# Patient Record
Sex: Male | Born: 1945 | Race: White | Hispanic: No | Marital: Single | Smoking: Former smoker
Health system: Southern US, Community
[De-identification: ages and names within clinical notes are randomized; demographics above are authoritative.]

## PROBLEM LIST (undated history)

## (undated) DIAGNOSIS — I219 Acute myocardial infarction, unspecified: Secondary | ICD-10-CM

## (undated) DIAGNOSIS — R569 Unspecified convulsions: Secondary | ICD-10-CM

## (undated) DIAGNOSIS — M810 Age-related osteoporosis without current pathological fracture: Secondary | ICD-10-CM

## (undated) DIAGNOSIS — R197 Diarrhea, unspecified: Secondary | ICD-10-CM

## (undated) DIAGNOSIS — I739 Peripheral vascular disease, unspecified: Secondary | ICD-10-CM

## (undated) DIAGNOSIS — F0390 Unspecified dementia without behavioral disturbance: Secondary | ICD-10-CM

## (undated) DIAGNOSIS — H9190 Unspecified hearing loss, unspecified ear: Secondary | ICD-10-CM

## (undated) DIAGNOSIS — R519 Headache, unspecified: Secondary | ICD-10-CM

## (undated) DIAGNOSIS — G709 Myoneural disorder, unspecified: Secondary | ICD-10-CM

## (undated) DIAGNOSIS — H53489 Generalized contraction of visual field, unspecified eye: Secondary | ICD-10-CM

## (undated) DIAGNOSIS — H547 Unspecified visual loss: Secondary | ICD-10-CM

## (undated) DIAGNOSIS — I639 Cerebral infarction, unspecified: Secondary | ICD-10-CM

## (undated) DIAGNOSIS — I509 Heart failure, unspecified: Secondary | ICD-10-CM

## (undated) DIAGNOSIS — R011 Cardiac murmur, unspecified: Secondary | ICD-10-CM

## (undated) DIAGNOSIS — R51 Headache: Secondary | ICD-10-CM

## (undated) DIAGNOSIS — M1990 Unspecified osteoarthritis, unspecified site: Secondary | ICD-10-CM

## (undated) DIAGNOSIS — I499 Cardiac arrhythmia, unspecified: Secondary | ICD-10-CM

## (undated) DIAGNOSIS — I10 Essential (primary) hypertension: Secondary | ICD-10-CM

## (undated) DIAGNOSIS — K219 Gastro-esophageal reflux disease without esophagitis: Secondary | ICD-10-CM

## (undated) DIAGNOSIS — E7800 Pure hypercholesterolemia, unspecified: Secondary | ICD-10-CM

## (undated) DIAGNOSIS — Z950 Presence of cardiac pacemaker: Secondary | ICD-10-CM

## (undated) DIAGNOSIS — I2510 Atherosclerotic heart disease of native coronary artery without angina pectoris: Secondary | ICD-10-CM

## (undated) HISTORY — PX: VASCULAR SURGERY: SHX849

## (undated) HISTORY — PX: INSERT / REPLACE / REMOVE PACEMAKER: SUR710

## (undated) HISTORY — PX: OTHER SURGICAL HISTORY: SHX169

## (undated) HISTORY — PX: HERNIA REPAIR: SHX51

## (undated) HISTORY — PX: CARDIAC VALVE SURGERY: SHX40

---

## 1994-09-28 DIAGNOSIS — I639 Cerebral infarction, unspecified: Secondary | ICD-10-CM

## 1994-09-28 HISTORY — DX: Cerebral infarction, unspecified: I63.9

## 2003-10-01 ENCOUNTER — Other Ambulatory Visit: Payer: Self-pay

## 2007-10-25 ENCOUNTER — Ambulatory Visit: Payer: Self-pay | Admitting: Vascular Surgery

## 2008-01-15 ENCOUNTER — Ambulatory Visit: Payer: Self-pay | Admitting: Family Medicine

## 2008-02-11 ENCOUNTER — Ambulatory Visit: Payer: Self-pay | Admitting: Family Medicine

## 2009-03-17 ENCOUNTER — Ambulatory Visit: Payer: Self-pay | Admitting: Family Medicine

## 2009-04-09 ENCOUNTER — Ambulatory Visit: Payer: Self-pay | Admitting: Unknown Physician Specialty

## 2009-08-20 ENCOUNTER — Ambulatory Visit: Payer: Self-pay | Admitting: Family Medicine

## 2009-09-05 ENCOUNTER — Ambulatory Visit: Payer: Self-pay | Admitting: Diagnostic Neuroimaging

## 2009-10-28 ENCOUNTER — Ambulatory Visit: Payer: Self-pay | Admitting: Surgery

## 2009-12-11 ENCOUNTER — Ambulatory Visit: Payer: Self-pay | Admitting: Surgery

## 2009-12-17 ENCOUNTER — Ambulatory Visit: Payer: Self-pay | Admitting: Surgery

## 2011-07-25 ENCOUNTER — Other Ambulatory Visit: Payer: Self-pay | Admitting: Family Medicine

## 2011-08-10 DIAGNOSIS — R55 Syncope and collapse: Secondary | ICD-10-CM | POA: Insufficient documentation

## 2011-08-10 DIAGNOSIS — I453 Trifascicular block: Secondary | ICD-10-CM | POA: Insufficient documentation

## 2011-08-10 DIAGNOSIS — Z950 Presence of cardiac pacemaker: Secondary | ICD-10-CM | POA: Insufficient documentation

## 2011-09-02 DIAGNOSIS — I4821 Permanent atrial fibrillation: Secondary | ICD-10-CM | POA: Insufficient documentation

## 2013-01-24 ENCOUNTER — Other Ambulatory Visit: Payer: Self-pay | Admitting: Family Medicine

## 2013-01-24 LAB — PROTIME-INR
INR: 2
Prothrombin Time: 22.7 secs — ABNORMAL HIGH (ref 11.5–14.7)

## 2013-06-13 DIAGNOSIS — Z7189 Other specified counseling: Secondary | ICD-10-CM | POA: Insufficient documentation

## 2013-06-13 DIAGNOSIS — Z45018 Encounter for adjustment and management of other part of cardiac pacemaker: Secondary | ICD-10-CM | POA: Insufficient documentation

## 2014-03-30 ENCOUNTER — Other Ambulatory Visit: Payer: Self-pay | Admitting: Family Medicine

## 2014-03-30 LAB — COMPREHENSIVE METABOLIC PANEL
ALBUMIN: 3.9 g/dL (ref 3.4–5.0)
ALK PHOS: 96 U/L
ALT: 35 U/L (ref 12–78)
Anion Gap: 9 (ref 7–16)
BUN: 27 mg/dL — ABNORMAL HIGH (ref 7–18)
Bilirubin,Total: 0.6 mg/dL (ref 0.2–1.0)
Calcium, Total: 8.9 mg/dL (ref 8.5–10.1)
Chloride: 101 mmol/L (ref 98–107)
Co2: 27 mmol/L (ref 21–32)
Creatinine: 1.29 mg/dL (ref 0.60–1.30)
EGFR (Non-African Amer.): 57 — ABNORMAL LOW
Glucose: 94 mg/dL (ref 65–99)
Osmolality: 279 (ref 275–301)
Potassium: 4.2 mmol/L (ref 3.5–5.1)
SGOT(AST): 27 U/L (ref 15–37)
SODIUM: 137 mmol/L (ref 136–145)
TOTAL PROTEIN: 6.9 g/dL (ref 6.4–8.2)

## 2014-03-30 LAB — CBC WITH DIFFERENTIAL/PLATELET
Basophil #: 0 10*3/uL (ref 0.0–0.1)
Basophil %: 0.6 %
EOS PCT: 1.5 %
Eosinophil #: 0.1 10*3/uL (ref 0.0–0.7)
HCT: 42.6 % (ref 40.0–52.0)
HGB: 14.8 g/dL (ref 13.0–18.0)
LYMPHS ABS: 1.2 10*3/uL (ref 1.0–3.6)
Lymphocyte %: 19.5 %
MCH: 30.9 pg (ref 26.0–34.0)
MCHC: 34.7 g/dL (ref 32.0–36.0)
MCV: 89 fL (ref 80–100)
Monocyte #: 0.4 x10 3/mm (ref 0.2–1.0)
Monocyte %: 6.5 %
Neutrophil #: 4.3 10*3/uL (ref 1.4–6.5)
Neutrophil %: 71.9 %
PLATELETS: 112 10*3/uL — AB (ref 150–440)
RBC: 4.78 10*6/uL (ref 4.40–5.90)
RDW: 13.6 % (ref 11.5–14.5)
WBC: 6 10*3/uL (ref 3.8–10.6)

## 2014-03-30 LAB — PROTIME-INR
INR: 1.9
PROTHROMBIN TIME: 21 s — AB (ref 11.5–14.7)

## 2014-04-24 DIAGNOSIS — I6930 Unspecified sequelae of cerebral infarction: Secondary | ICD-10-CM | POA: Insufficient documentation

## 2014-06-25 DIAGNOSIS — H539 Unspecified visual disturbance: Secondary | ICD-10-CM | POA: Insufficient documentation

## 2014-06-25 DIAGNOSIS — I69398 Other sequelae of cerebral infarction: Secondary | ICD-10-CM | POA: Insufficient documentation

## 2014-10-20 ENCOUNTER — Ambulatory Visit: Payer: Self-pay | Admitting: Family Medicine

## 2016-01-31 ENCOUNTER — Other Ambulatory Visit: Payer: Self-pay | Admitting: Vascular Surgery

## 2016-02-04 ENCOUNTER — Encounter: Payer: Self-pay | Admitting: *Deleted

## 2016-02-04 ENCOUNTER — Encounter
Admission: RE | Disposition: A | Discharge status: Skilled Nursing Facility | Payer: Self-pay | Source: Ambulatory Visit | Attending: Vascular Surgery

## 2016-02-04 ENCOUNTER — Ambulatory Visit
Admission: RE | Admit: 2016-02-04 | Discharge: 2016-02-04 | Disposition: A | Discharge status: Skilled Nursing Facility | Payer: Medicare Other | Source: Ambulatory Visit | Attending: Vascular Surgery | Admitting: Vascular Surgery

## 2016-02-04 DIAGNOSIS — R531 Weakness: Secondary | ICD-10-CM | POA: Insufficient documentation

## 2016-02-04 DIAGNOSIS — I110 Hypertensive heart disease with heart failure: Secondary | ICD-10-CM | POA: Insufficient documentation

## 2016-02-04 DIAGNOSIS — I509 Heart failure, unspecified: Secondary | ICD-10-CM | POA: Diagnosis not present

## 2016-02-04 DIAGNOSIS — M810 Age-related osteoporosis without current pathological fracture: Secondary | ICD-10-CM | POA: Insufficient documentation

## 2016-02-04 DIAGNOSIS — Z6827 Body mass index (BMI) 27.0-27.9, adult: Secondary | ICD-10-CM | POA: Insufficient documentation

## 2016-02-04 DIAGNOSIS — E46 Unspecified protein-calorie malnutrition: Secondary | ICD-10-CM | POA: Insufficient documentation

## 2016-02-04 DIAGNOSIS — E7800 Pure hypercholesterolemia, unspecified: Secondary | ICD-10-CM | POA: Insufficient documentation

## 2016-02-04 DIAGNOSIS — Z79899 Other long term (current) drug therapy: Secondary | ICD-10-CM | POA: Insufficient documentation

## 2016-02-04 DIAGNOSIS — I499 Cardiac arrhythmia, unspecified: Secondary | ICD-10-CM | POA: Insufficient documentation

## 2016-02-04 DIAGNOSIS — K219 Gastro-esophageal reflux disease without esophagitis: Secondary | ICD-10-CM | POA: Diagnosis not present

## 2016-02-04 DIAGNOSIS — Z7902 Long term (current) use of antithrombotics/antiplatelets: Secondary | ICD-10-CM | POA: Diagnosis not present

## 2016-02-04 DIAGNOSIS — I70222 Atherosclerosis of native arteries of extremities with rest pain, left leg: Secondary | ICD-10-CM | POA: Insufficient documentation

## 2016-02-04 DIAGNOSIS — R5383 Other fatigue: Secondary | ICD-10-CM | POA: Insufficient documentation

## 2016-02-04 DIAGNOSIS — G710 Muscular dystrophy: Secondary | ICD-10-CM | POA: Diagnosis not present

## 2016-02-04 HISTORY — DX: Peripheral vascular disease, unspecified: I73.9

## 2016-02-04 HISTORY — DX: Myoneural disorder, unspecified: G70.9

## 2016-02-04 HISTORY — DX: Cardiac arrhythmia, unspecified: I49.9

## 2016-02-04 HISTORY — PX: PERIPHERAL VASCULAR CATHETERIZATION: SHX172C

## 2016-02-04 HISTORY — DX: Heart failure, unspecified: I50.9

## 2016-02-04 HISTORY — DX: Gastro-esophageal reflux disease without esophagitis: K21.9

## 2016-02-04 HISTORY — DX: Essential (primary) hypertension: I10

## 2016-02-04 LAB — PROTIME-INR
INR: 2.04
PROTHROMBIN TIME: 22.9 s — AB (ref 11.4–15.0)

## 2016-02-04 LAB — CREATININE, SERUM
CREATININE: 1.09 mg/dL (ref 0.61–1.24)
GFR calc Af Amer: >60 mL/min (ref >60)
GFR calc non Af Amer: >60 mL/min (ref >60)

## 2016-02-04 LAB — BUN: BUN: 16 mg/dL (ref 6–20)

## 2016-02-04 SURGERY — LOWER EXTREMITY ANGIOGRAPHY
Anesthesia: Moderate Sedation | Site: Leg Lower | Laterality: Left

## 2016-02-04 MED ORDER — SODIUM CHLORIDE 0.9 % IV SOLN
INTRAVENOUS | Status: DC
Start: 2016-02-04 — End: 2016-02-04
  Administered 2016-02-04: 09:00:00 via INTRAVENOUS

## 2016-02-04 MED ORDER — MIDAZOLAM HCL 5 MG/5ML IJ SOLN
INTRAMUSCULAR | Status: AC
  Filled 2016-02-04: qty 5

## 2016-02-04 MED ORDER — MIDAZOLAM HCL 2 MG/2ML IJ SOLN
INTRAMUSCULAR | Status: DC | PRN
  Administered 2016-02-04 (×3): 1 mg via INTRAVENOUS
  Administered 2016-02-04: 2 mg via INTRAVENOUS
  Administered 2016-02-04: 1 mg via INTRAVENOUS

## 2016-02-04 MED ORDER — ASPIRIN EC 81 MG PO TBEC: 81.0000 mg | DELAYED_RELEASE_TABLET | Freq: Every day | ORAL | Status: DC

## 2016-02-04 MED ORDER — DEXTROSE 5 % IV SOLN
1.5000 g | INTRAVENOUS | Status: AC
  Administered 2016-02-04: 1.5 g via INTRAVENOUS

## 2016-02-04 MED ORDER — HEPARIN SODIUM (PORCINE) 1000 UNIT/ML IJ SOLN
INTRAMUSCULAR | Status: AC
  Filled 2016-02-04: qty 1

## 2016-02-04 MED ORDER — FAMOTIDINE 20 MG PO TABS: 40.0000 mg | ORAL_TABLET | ORAL | Status: DC | PRN

## 2016-02-04 MED ORDER — FENTANYL CITRATE (PF) 100 MCG/2ML IJ SOLN
INTRAMUSCULAR | Status: DC | PRN
  Administered 2016-02-04 (×2): 50 ug via INTRAVENOUS

## 2016-02-04 MED ORDER — IOPAMIDOL (ISOVUE-300) INJECTION 61%
INTRAVENOUS | Status: DC | PRN
  Administered 2016-02-04: 90 mL via INTRA_ARTERIAL

## 2016-02-04 MED ORDER — ONDANSETRON HCL 4 MG/2ML IJ SOLN: 4.0000 mg | Freq: Four times a day (QID) | INTRAMUSCULAR | Status: DC | PRN

## 2016-02-04 MED ORDER — LIDOCAINE-EPINEPHRINE (PF) 1 %-1:200000 IJ SOLN
INTRAMUSCULAR | Status: DC | PRN
  Administered 2016-02-04: 10 mL via INTRADERMAL

## 2016-02-04 MED ORDER — HEPARIN SODIUM (PORCINE) 1000 UNIT/ML IJ SOLN
INTRAMUSCULAR | Status: DC | PRN
  Administered 2016-02-04: 5000 [IU] via INTRAVENOUS

## 2016-02-04 MED ORDER — FENTANYL CITRATE (PF) 100 MCG/2ML IJ SOLN
INTRAMUSCULAR | Status: AC
  Filled 2016-02-04: qty 2

## 2016-02-04 MED ORDER — HYDROMORPHONE HCL 1 MG/ML IJ SOLN: 1.0000 mg | Freq: Once | INTRAMUSCULAR | Status: DC

## 2016-02-04 MED ORDER — METHYLPREDNISOLONE SODIUM SUCC 125 MG IJ SOLR: 125.0000 mg | INTRAMUSCULAR | Status: DC | PRN

## 2016-02-04 SURGICAL SUPPLY — 25 items
BALLN LUTONIX DCB 4X40X130 (BALLOONS) ×4
BALLN LUTONIX DCB 6X40X130 (BALLOONS) ×4
BALLN LUTONIX DCB 6X60X130 (BALLOONS) ×4
BALLN LUTONIX DCB 7X40X130 (BALLOONS) ×4
BALLOON LUTONIX DCB 4X40X130 (BALLOONS) ×2 IMPLANT
BALLOON LUTONIX DCB 6X40X130 (BALLOONS) ×2 IMPLANT
BALLOON LUTONIX DCB 6X60X130 (BALLOONS) ×2 IMPLANT
BALLOON LUTONIX DCB 7X40X130 (BALLOONS) ×2 IMPLANT
CATH G STR 4FX120.038 (CATHETERS) ×4 IMPLANT
CATH PIG 70CM (CATHETERS) ×4 IMPLANT
CATH RIM 65CM (CATHETERS) ×4 IMPLANT
DEVICE PRESTO INFLATION (MISCELLANEOUS) ×4 IMPLANT
DEVICE STARCLOSE SE CLOSURE (Vascular Products) ×4 IMPLANT
DEVICE TORQUE (MISCELLANEOUS) ×4 IMPLANT
GLIDEWIRE ANGLED SS 035X260CM (WIRE) ×4 IMPLANT
PACK ANGIOGRAPHY (CUSTOM PROCEDURE TRAY) ×4 IMPLANT
SET INTRO CAPELLA COAXIAL (SET/KITS/TRAYS/PACK) ×4 IMPLANT
SHEATH BRITE TIP 5FRX11 (SHEATH) ×4 IMPLANT
SHEATH HIGHFLEX ANSEL 6FRX55 (SHEATH) ×4 IMPLANT
STENT LIFESTAR 7X60X80 (Permanent Stent) ×4 IMPLANT
STENT LIFESTAR 8X40 (Permanent Stent) ×4 IMPLANT
SYR MEDRAD MARK V 150ML (SYRINGE) ×4 IMPLANT
TUBING CONTRAST HIGH PRESS 72 (TUBING) ×4 IMPLANT
WIRE J 3MM .035X145CM (WIRE) ×4 IMPLANT
WIRE MAGIC TORQUE 260C (WIRE) ×4 IMPLANT

## 2016-02-04 NOTE — Discharge Instructions (Signed)
Angiogram, Care After °Refer to this sheet in the next few weeks. These instructions provide you with information about caring for yourself after your procedure. Your health care provider may also give you more specific instructions. Your treatment has been planned according to current medical practices, but problems sometimes occur. Call your health care provider if you have any problems or questions after your procedure. °WHAT TO EXPECT AFTER THE PROCEDURE °After your procedure, it is typical to have the following: °· Bruising at the catheter insertion site that usually fades within 1-2 weeks. °· Blood collecting in the tissue (hematoma) that may be painful to the touch. It should usually decrease in size and tenderness within 1-2 weeks. °HOME CARE INSTRUCTIONS °· Take medicines only as directed by your health care provider. °· You may shower 24-48 hours after the procedure or as directed by your health care provider. Remove the bandage (dressing) and gently wash the site with plain soap and water. Pat the area dry with a clean towel. Do not rub the site, because this may cause bleeding. °· Do not take baths, swim, or use a hot tub until your health care provider approves. °· Check your insertion site every day for redness, swelling, or drainage. °· Do not apply powder or lotion to the site. °· Do not lift over 10 lb (4.5 kg) for 5 days after your procedure or as directed by your health care provider. °· Ask your health care provider when it is okay to: °¨ Return to work or school. °¨ Resume usual physical activities or sports. °¨ Resume sexual activity. °· Do not drive home if you are discharged the same day as the procedure. Have someone else drive you. °· You may drive 24 hours after the procedure unless otherwise instructed by your health care provider. °· Do not operate machinery or power tools for 24 hours after the procedure or as directed by your health care provider. °· If your procedure was done as an  outpatient procedure, which means that you went home the same day as your procedure, a responsible adult should be with you for the first 24 hours after you arrive home. °· Keep all follow-up visits as directed by your health care provider. This is important. °SEEK MEDICAL CARE IF: °· You have a fever. °· You have chills. °· You have increased bleeding from the catheter insertion site. Hold pressure on the site. °SEEK IMMEDIATE MEDICAL CARE IF: °· You have unusual pain at the catheter insertion site. °· You have redness, warmth, or swelling at the catheter insertion site. °· You have drainage (other than a small amount of blood on the dressing) from the catheter insertion site. °· The catheter insertion site is bleeding, and the bleeding does not stop after 30 minutes of holding steady pressure on the site. °· The area near or just beyond the catheter insertion site becomes pale, cool, tingly, or numb. °  °This information is not intended to replace advice given to you by your health care provider. Make sure you discuss any questions you have with your health care provider. °  °Document Released: 04/02/2005 Document Revised: 10/05/2014 Document Reviewed: 02/15/2013 °Elsevier Interactive Patient Education ©2016 [employer] ° °

## 2016-02-04 NOTE — Op Note (Signed)
[facility]  Percutaneous Study/Intervention Procedural Note   Date of Surgery: 02/04/2016,3:03 PM  Surgeon:Schnier, Latina Craver   Pre-operative Diagnosis: Atherosclerotic occlusive disease bilateral lower extremities with rest pain left lower extremity; history of femoral-popliteal bypass graft now wishes complication slow, pre-thrombotic flow rate.  Post-operative diagnosis:  Same  Procedure(s) Performed:  1.  Abdominal aortogram  2.  Left lower extremity angiography third order catheter placement into the anterior tibial artery  3.  Additional third order catheter placement into the left peroneal artery  4.  Percutaneous transluminal angioplasty left anterior tibial to 4 mm with Lutonix balloon  5.  Percutaneous transluminal angioplasty and stent placement left common iliac artery to 7 mm with Lutonix balloon 6.  Percutaneous transluminal angioplasty and stent placement left external iliac artery to 6 mm with Lutonix balloon  7.  StarClose right common femoral artery  Anesthesia: Conscious sedation was administered under my direct supervision. IV Versed plus fentanyl were utilized. Continuous ECG, pulse oximetry and blood pressure was monitored throughout the entire procedure. Versed and fentanyl were utilized.  Conscious sedation was administered for a total of 90 minutes.  Sheath: 6 [place] Ansell high flex sheath right common femoral  Contrast: 90 cc   Fluoroscopy Time: 11.8 minutes  Indications:  Patient presented to the office with increasing pain in his left lower extremity. He has an extensive history of atherosclerosis and prior interventions and surgery. On noninvasive studies he has very slow pre-thrombotic flow in his femoral popliteal bypass and monophasic signals at the ankle. The risks and benefits for angiography intervention are reviewed all questions are answered patient agrees to proceed.  Procedure:  John Rodgers a 70 y.o. male who was  identified and appropriate procedural time out was performed.  The patient was then placed supine on the table and prepped and draped in the usual sterile fashion.  Ultrasound was used to evaluate the right common femoral artery.  It was patent .  A digital ultrasound image was acquired.  Amicropuncture needle was used to access the right common femoral artery under direct ultrasound guidance and a permanent image wassaved for the record.microwire was then advanced under fluoroscopic guidance followed by micro-sheath.  A 0.035 J wire was advanced without resistance and a 5Fr sheath was placed.    J-wire picked a catheter were positioned at the level of T12 and AP projection of the aorta was obtained. Pigtail catheter was then repositioned to above the bifurcation and an RAO projection of the pelvis is obtained. Stiff angle Glidewire and a rim catheter used to cross the bifurcation and the rim catheters negotiated down into the distal external iliac where and LAO projection of the left common femoral and femoral bifurcation is obtained. The glide wires then negotiated into the bypass SFA is nonvisualized throughout its entire length.  5000 units of heparin was given and allowed circulate.  5 French sheath is then excised exchanged for a 6 [place] Ansell and the tip positioned in the mid external where magnified imaging of the distal external is performed to better characterize the greater than 90% stenosis previously noted on the RAO projection. Subsequently a 4 x 6 Lutonix balloon is advanced across this lesion and inflated to 10 atm for 2 minutes. The dilator the sheath was then reintroduced and the dilator positioned down to the level of the common femoral. Catheter was then advanced over the Glidewire down to the level of the where magnified imaging Center the distal anastomosis and trifurcation are  performed. Catheter subsequent advanced first into the peroneal were distal imaging is performed it is then  pulled back and the wire and catheter negotiated into the anterior tibial where selective imaging is performed. This represents additional third order catheter placement.  The glide wires then exchanged for a Magic torque wire the catheters removed and a 4 x 40 Lutonix balloon is brought across the origin of the anterior tibial angioplasty is performed to 6 atm for 2 minutes. Follow-up imaging with a catheter directed at the level of the distal popliteal demonstrates marked improvement in the stricture and preservation of distal runoff.  Attention is then turned to the iliac lesions the previously treated external iliac retains a greater than 50% stenosis. Therefore at this level a 7 x 60 like*stent is deployed. This stent is postdilated with a 6 x 60 Lutonix balloon inflated to 14 atm for a little over 1 minute. This resolves this lesion.  A second 70% in-stent restenosis in the common iliac is also identified and this is treated with an 8 x 40 like*stent postdilated with a 7 x 40 Lutonix balloon again to 14 atm for a little over 1 minute. This 2 resolves this lesion.  The sheath is then pulled back into the right external iliac and a Star close device deployed after an oblique views obtained.  Findings:   Aortogram:  Diffusely diseased but there are no hemodynamically significant lesions. Previously noted stents in the common iliacs are present on the right side there is diffuse in-stent restenosis but nothing greater than 50%. On the left side there is a focal area in the distal common of 70%. There is diffuse disease throughout the external iliac on the left with a focal 90% stenosis. As described above these 2 lesions are treated with 2 separate stents and postdilated to 6 mm in the external 7 mm the common.  Right Lower Extremity:  Common femoral is patent with a puncture in its midportion.  Left Lower Extremity:  Common femoral is somewhat dilated consistent with endarterectomy for bypass surgery  there is nonvisualization of the SFA throughout its entire course. Bypass is noted and appears to be a femoral below-knee reversed vein bypass. Profunda femoris is widely patent. Distally there is a junk graft which goes directly to the anterior tibial and there is a high-grade 70% stenosis at the origin of the  jump graft. This is treated with a 4 mm Lutonix balloon right effectively. The bypass proper is anastomosed to the peroneal and on selective imaging the high-grade stricture identified by ultrasound is not visualized. With selective angiography of both the anterior tibial as well as the peroneal it is quite clear that there is extensive distal small vessel disease with very poor flow although the anterior tibial does fill the dorsalis pedis the pedal arch and digital vessels are quite diseased.  SUMMARY: Successful intervention multiple levels left lower extremity for limb salvage and preservation of the patency of his bypass.    Disposition: Patient was taken to the recovery room in stable condition having tolerated the procedure well.  Schnier, Dolores Lory 02/04/2016,3:03 PM

## 2016-02-04 NOTE — H&P (Signed)
[facility] History & Physical Update  The patient was interviewed and re-examined.  The patient's previous History and Physical has been reviewed and is unchanged.  There is no change in the plan of care. We plan to proceed with the scheduled procedure.  Schnier, Latina CraverGregory G, MD  02/04/2016, 10:43 AM

## 2016-02-05 ENCOUNTER — Encounter: Payer: Self-pay | Admitting: Vascular Surgery

## 2016-05-04 DIAGNOSIS — I509 Heart failure, unspecified: Secondary | ICD-10-CM | POA: Insufficient documentation

## 2016-07-29 DIAGNOSIS — F0150 Vascular dementia without behavioral disturbance: Secondary | ICD-10-CM | POA: Insufficient documentation

## 2016-10-19 ENCOUNTER — Other Ambulatory Visit
Admission: RE | Admit: 2016-10-19 | Discharge: 2016-10-19 | Disposition: A | Discharge status: Home / Self Care | Payer: Medicare Other | Source: Ambulatory Visit | Attending: Adult Health | Admitting: Adult Health

## 2016-10-19 DIAGNOSIS — J111 Influenza due to unidentified influenza virus with other respiratory manifestations: Secondary | ICD-10-CM | POA: Diagnosis present

## 2016-10-19 LAB — INFLUENZA PANEL BY PCR (TYPE A & B)
INFLBPCR: NEGATIVE
Influenza A By PCR: POSITIVE — AB

## 2016-12-03 ENCOUNTER — Encounter (INDEPENDENT_AMBULATORY_CARE_PROVIDER_SITE_OTHER): Payer: Self-pay

## 2016-12-03 ENCOUNTER — Ambulatory Visit (INDEPENDENT_AMBULATORY_CARE_PROVIDER_SITE_OTHER): Payer: Self-pay | Admitting: Vascular Surgery

## 2017-01-05 ENCOUNTER — Other Ambulatory Visit (INDEPENDENT_AMBULATORY_CARE_PROVIDER_SITE_OTHER): Payer: Self-pay | Admitting: Vascular Surgery

## 2017-01-05 DIAGNOSIS — I700 Atherosclerosis of aorta: Secondary | ICD-10-CM

## 2017-01-05 DIAGNOSIS — I739 Peripheral vascular disease, unspecified: Secondary | ICD-10-CM

## 2017-01-06 ENCOUNTER — Ambulatory Visit (INDEPENDENT_AMBULATORY_CARE_PROVIDER_SITE_OTHER): Payer: Medicare Other

## 2017-01-06 ENCOUNTER — Encounter (INDEPENDENT_AMBULATORY_CARE_PROVIDER_SITE_OTHER): Payer: Self-pay | Admitting: Vascular Surgery

## 2017-01-06 ENCOUNTER — Ambulatory Visit (INDEPENDENT_AMBULATORY_CARE_PROVIDER_SITE_OTHER): Payer: Medicare Other | Admitting: Vascular Surgery

## 2017-01-06 VITALS — BP 137/77 | HR 77 | Resp 17 | Ht 70.0 in | Wt 170.0 lb

## 2017-01-06 DIAGNOSIS — E785 Hyperlipidemia, unspecified: Secondary | ICD-10-CM

## 2017-01-06 DIAGNOSIS — I10 Essential (primary) hypertension: Secondary | ICD-10-CM

## 2017-01-06 DIAGNOSIS — I700 Atherosclerosis of aorta: Secondary | ICD-10-CM

## 2017-01-06 DIAGNOSIS — I739 Peripheral vascular disease, unspecified: Secondary | ICD-10-CM

## 2017-03-23 DIAGNOSIS — E785 Hyperlipidemia, unspecified: Secondary | ICD-10-CM | POA: Insufficient documentation

## 2017-03-23 DIAGNOSIS — I739 Peripheral vascular disease, unspecified: Secondary | ICD-10-CM | POA: Insufficient documentation

## 2017-03-23 DIAGNOSIS — I10 Essential (primary) hypertension: Secondary | ICD-10-CM | POA: Insufficient documentation

## 2017-03-23 NOTE — Progress Notes (Signed)
Subjective:    Patient ID: John Rodgers, male    DOB: 1946-05-23, 71 y.o.   MRN: [id number] Chief Complaint  Patient presents with  . Follow-up   Patient presents for a 6 month peripheral artery disease follow-up. The patient presents today without complaint. He denies any claudication, rest pain or ulcer development to his lower extremities. The patient underwent a bilateral lower arterial ABI which was notable for right: Noncompressible arteries due to medial calcification / Left: 1.25. Great toe pressure and PPG waveforms are decreased bilaterally. An aortoiliac arterial duplex was notable for triphasic blood flow noted through the common iliac artery with biphasic blood flow through the external iliac noted in the right lower extremity / triphasic blood flow noted in the common iliac and external iliac artery. When compared to the previous exam on 05/29/2016 there has been a slight increase in right external iliac artery velocities. Patient denies fever, nausea or vomiting.   Review of Systems  Constitutional: Negative.   HENT: Negative.   Eyes: Negative.   Respiratory: Negative.   Cardiovascular: Negative.   Gastrointestinal: Negative.   Endocrine: Negative.   Genitourinary: Negative.   Musculoskeletal: Negative.   Skin: Negative.   Allergic/Immunologic: Negative.   Neurological: Negative.   Hematological: Negative.   Psychiatric/Behavioral: Negative.       Objective:   Physical Exam  Constitutional: He is oriented to person, place, and time. He appears well-developed and well-nourished. No distress.  HENT:  Head: Normocephalic and atraumatic.  Eyes: Conjunctivae are normal. Pupils are equal, round, and reactive to light.  Neck: Normal range of motion.  Cardiovascular: Normal rate, regular rhythm, normal heart sounds and intact distal pulses.   Pulses:      Radial pulses are 2+ on the right side, and 2+ on the left side.       Dorsalis pedis pulses are 2+ on the right side,  and 2+ on the left side.       Posterior tibial pulses are 2+ on the right side, and 2+ on the left side.  Pulmonary/Chest: Effort normal.  Musculoskeletal: Normal range of motion. He exhibits no edema.  Neurological: He is alert and oriented to person, place, and time.  Skin: Skin is warm and dry. He is not diaphoretic.  Psychiatric: He has a normal mood and affect. His behavior is normal. Judgment and thought content normal.  Vitals reviewed.  BP 137/77   Pulse 77   Resp 17   Ht 5\' 10"  (1.778 m)   Wt 170 lb (77.1 kg)   BMI 24.39 kg/m   Past Medical History:  Diagnosis Date  . CHF (congestive heart failure) (HCC)   . Dysrhythmia   . GERD (gastroesophageal reflux disease)   . Hypertension   . Neuromuscular disorder (HCC)   . Peripheral vascular disease [facility])    Social History   Social History  . Marital status: Single    Spouse name: N/A  . Number of children: N/A  . Years of education: N/A   Occupational History  . Not on file.   Social History Main Topics  . Smoking status: Former Games developer  . Smokeless tobacco: Never Used  . Alcohol use No  . Drug use: No  . Sexual activity: Not on file   Other Topics Concern  . Not on file   Social History Narrative  . No narrative on file   Past Surgical History:  Procedure Laterality Date  . PERIPHERAL VASCULAR CATHETERIZATION Left 02/04/2016   Procedure:  Lower Extremity Angiography;  Surgeon: Renford DillsGregory G Schnier, MD;  [facility];  Service: Cardiovascular;  Laterality: Left;  . PERIPHERAL VASCULAR CATHETERIZATION  02/04/2016   Procedure: Lower Extremity Intervention;  Surgeon: Renford DillsGregory G Schnier, MD;  Location: [facility] INVASIVE CV LAB;  Service: Cardiovascular;;   History reviewed. No pertinent family history.  Not on File     Assessment & Plan:  Patient presents for a 6 month peripheral artery disease follow-up. The patient presents today without complaint. He denies any claudication, rest pain or ulcer  development to his lower extremities. The patient underwent a bilateral lower arterial ABI which was notable for right: Noncompressible arteries due to medial calcification / Left: 1.25. Great toe pressure and PPG waveforms are decreased bilaterally. An aortoiliac arterial duplex was notable for triphasic blood flow noted through the common iliac artery with biphasic blood flow through the external iliac noted in the right lower extremity / triphasic blood flow noted in the common iliac and external iliac artery. When compared to the previous exam on 05/29/2016 there has been a slight increase in right external iliac artery velocities. Patient denies fever, nausea or vomiting.  1. Peripheral artery disease (HCC) - stable Studies reviewed with patient. Patient is asymptomatic Physical exam is unremarkable Relatively stable ABI and arterial duplex with the exception of some increased velocities to the right external iliac artery Patient to follow up in 6 months with ABI and lower extremity arterial duplex Patient to remain abstinent of tobacco use. I have discussed with the patient at length the risk factors for and pathogenesis of atherosclerotic disease and encouraged a healthy diet, regular exercise regimen and blood pressure / glucose control.  The patient was encouraged to call the office in the interim if he experiences any claudication like symptoms, rest pain or ulcers to his feet / toes.  - VAS US ABI WITH/WO TBI; Future - VAS US AORTA/IVC/ILIACS; Future  2. Essential hypertension - stable Encouraged good control as its slows the progression of atherosclerotic disease  3. Hyperlipidemia, unspecified hyperlipidemia type - stable peripheral artery disease Encouraged good control as its slows the progression of atherosclerotic disease  Current Outpatient Prescriptions on File Prior to Visit  Medication Sig Dispense Refill  . aspirin EC 81 MG tablet Take 1 tablet (81 mg total) by mouth  daily. 150 tablet 2  . calcium carbonate (TUMS - DOSED IN MG ELEMENTAL CALCIUM) 500 MG chewable tablet Chew 1 tablet by mouth daily.    . ramipril (ALTACE) 1.25 MG capsule Take 1.25 mg by mouth daily.    . simvastatin (ZOCOR) 10 MG tablet Take 10 mg by mouth daily.    Marland Kitchen. warfarin (COUMADIN) 10 MG tablet Take 10 mg by mouth daily.    . metoprolol succinate (TOPROL-XL) 25 MG 24 hr tablet Take 25 mg by mouth daily.     No current facility-administered medications on file prior to visit.     There are no Patient Instructions on file for this visit. No Follow-up on file.   KIMBERLY A STEGMAYER, PA-C

## 2017-04-28 ENCOUNTER — Telehealth (INDEPENDENT_AMBULATORY_CARE_PROVIDER_SITE_OTHER): Payer: Self-pay | Admitting: Vascular Surgery

## 2017-04-28 NOTE — Telephone Encounter (Signed)
NURSE FROM [facility] CALLED AND WAS WONDERING IF THE PATIENT NEEDED TO COME IN OR COULD WAIT UNTIL OCT 15TH. PATIENT SAYING HIS LEGS HURT REAL BAD. IF YOU COULD GIVE THEM A CALL BACK

## 2017-04-28 NOTE — Telephone Encounter (Signed)
If you can bring the patient in earlier than Oct. 15th to be seen for his leg pain and move the u/s up with the appt. As well.

## 2017-04-29 ENCOUNTER — Other Ambulatory Visit (INDEPENDENT_AMBULATORY_CARE_PROVIDER_SITE_OTHER): Payer: Medicare Other

## 2017-04-29 ENCOUNTER — Encounter (INDEPENDENT_AMBULATORY_CARE_PROVIDER_SITE_OTHER): Payer: Self-pay | Admitting: Vascular Surgery

## 2017-04-29 ENCOUNTER — Other Ambulatory Visit (INDEPENDENT_AMBULATORY_CARE_PROVIDER_SITE_OTHER): Payer: Self-pay | Admitting: Vascular Surgery

## 2017-04-29 ENCOUNTER — Ambulatory Visit (INDEPENDENT_AMBULATORY_CARE_PROVIDER_SITE_OTHER): Payer: Medicare Other | Admitting: Vascular Surgery

## 2017-04-29 VITALS — BP 131/61 | HR 59 | Resp 16 | Ht 70.0 in | Wt 168.0 lb

## 2017-04-29 DIAGNOSIS — M79606 Pain in leg, unspecified: Secondary | ICD-10-CM | POA: Insufficient documentation

## 2017-04-29 DIAGNOSIS — I10 Essential (primary) hypertension: Secondary | ICD-10-CM

## 2017-04-29 DIAGNOSIS — M79604 Pain in right leg: Secondary | ICD-10-CM | POA: Diagnosis not present

## 2017-04-29 DIAGNOSIS — M5137 Other intervertebral disc degeneration, lumbosacral region: Secondary | ICD-10-CM

## 2017-04-29 DIAGNOSIS — I739 Peripheral vascular disease, unspecified: Secondary | ICD-10-CM

## 2017-04-29 DIAGNOSIS — E785 Hyperlipidemia, unspecified: Secondary | ICD-10-CM | POA: Diagnosis not present

## 2017-04-29 DIAGNOSIS — M79605 Pain in left leg: Secondary | ICD-10-CM

## 2017-04-29 DIAGNOSIS — M47817 Spondylosis without myelopathy or radiculopathy, lumbosacral region: Secondary | ICD-10-CM | POA: Insufficient documentation

## 2017-04-29 NOTE — Progress Notes (Signed)
MRN : [id number]  John Rodgers is a 71 y.o. (Apr 18, 1946) male who presents with chief complaint of worse leg pain and weakness.  History of Present Illness: The patient returns to the office for followup and review of the noninvasive studies.   Procedure(s) Performed in May 2017:             1.  Abdominal aortogram             2.  Left lower extremity angiography third order catheter placement into the anterior tibial artery             3.  Additional third order catheter placement into the left peroneal artery             4.  Percutaneous transluminal angioplasty left anterior tibial to 4 mm with Lutonix balloon             5.  Percutaneous transluminal angioplasty and stent placement left common iliac artery to 7 mm with Lutonix balloon   6.  Percutaneous transluminal angioplasty and stent placement left external iliac artery to 6 mm with Lutonix balloon             7.  StarClose right common femoral artery  He is here today sooner than expected because there has been a significant deterioration in the lower extremity symptoms.  The patient notes interval shortening of their claudication distance but he denies development of rest pain symptoms. No new ulcers or wounds have occurred since the last visit.  He states that he was unable to stand this morning when he tried to brush his teeth.  He feels he needs to use a walker rather than the cane he typically uses.  He notes he is still elevating at night and this makes his legs feel better.  There have been no significant changes to the patient's overall health care.  The patient denies amaurosis fugax or recent TIA symptoms. There are no recent neurological changes noted. The patient denies history of DVT, PE or superficial thrombophlebitis. The patient denies recent episodes of angina or shortness of breath.    No outpatient prescriptions have been marked as taking for the 04/29/17 encounter (Appointment) with Gilda Crease, Latina Craver, MD.     Past Medical History:  Diagnosis Date  . CHF (congestive heart failure) (HCC)   . Dysrhythmia   . GERD (gastroesophageal reflux disease)   . Hypertension   . Neuromuscular disorder (HCC)   . Peripheral vascular disease [facility])     Past Surgical History:  Procedure Laterality Date  . PERIPHERAL VASCULAR CATHETERIZATION Left 02/04/2016   Procedure: Lower Extremity Angiography;  Surgeon: Renford Dills, MD;  Location: [facility] INVASIVE CV LAB;  Service: Cardiovascular;  Laterality: Left;  . PERIPHERAL VASCULAR CATHETERIZATION  02/04/2016   Procedure: Lower Extremity Intervention;  Surgeon: Renford Dills, MD;  Location: [facility] INVASIVE CV LAB;  Service: Cardiovascular;;    Social History Social History  Substance Use Topics  . Smoking status: Former Games developer  . Smokeless tobacco: Never Used  . Alcohol use No    Family History No family history on file.  Not on File   REVIEW OF SYSTEMS (Negative unless checked)  Constitutional: [] Weight loss  [] Fever  [] Chills Cardiac: [] Chest pain   [] Chest pressure   [] Palpitations   [] Shortness of breath when laying flat   [x] Shortness of breath with exertion. Vascular:  [] Pain in legs with walking   [x] Pain in legs at rest  [] History of DVT   []   Phlebitis   [x] Swelling in legs   [] Varicose veins   [] Non-healing ulcers Pulmonary:   [] Uses home oxygen   [] Productive cough   [] Hemoptysis   [] Wheeze  [] COPD   [] Asthma Neurologic:  [] Dizziness   [] Seizures   [] History of stroke   [] History of TIA  [] Aphasia   [] Vissual changes   [] Weakness or numbness in arm   [x] Weakness or numbness in leg Musculoskeletal:   [] Joint swelling   [x] Joint pain   [x] Low back pain Hematologic:  [] Easy bruising  [] Easy bleeding   [] Hypercoagulable state   [] Anemic Gastrointestinal:  [] Diarrhea   [] Vomiting  [] Gastroesophageal reflux/heartburn   [] Difficulty swallowing. Genitourinary:  [] Chronic kidney disease   [] Difficult urination  [] Frequent urination   [] Blood in  urine Skin:  [] Rashes   [] Ulcers  Psychological:  [] History of anxiety   []  History of major depression.  Physical Examination  There were no vitals filed for this visit. There is no height or weight on file to calculate BMI. Gen: WD/WN, NAD Head: /AT, No temporalis wasting.  Ear/Nose/Throat: Hearing grossly intact, nares w/o erythema or drainage Eyes: PER, EOMI, sclera nonicteric.  Neck: Supple, no large masses.   Pulmonary:  Good air movement, no audible wheezing bilaterally, no use of accessory muscles.  Cardiac: RRR, no JVD Vascular:  Vessel Right Left  Radial Palpable Palpable  Ulnar Palpable Palpable  Brachial Palpable Palpable  Carotid Palpable Palpable  Femoral Palpable Palpable  Popliteal Not Palpable Not Palpable  PT Not Palpable Not Palpable  DP Not Palpable Not Palpable  Gastrointestinal: Non-distended. No guarding/no peritoneal signs.  Musculoskeletal: M/S 5/5 throughout.  No deformity or atrophy.  Neurologic: CN 2-12 intact. Symmetrical.  Speech is fluent. Motor exam as listed above. Psychiatric: Judgment intact, Mood & affect appropriate for pt's clinical situation. Dermatologic: No rashes or ulcers noted.  No changes consistent with cellulitis. Lymph : No lichenification or skin changes of chronic lymphedema.  CBC Lab Results  Component Value Date   WBC 6.0 03/30/2014   HGB 14.8 03/30/2014   HCT 42.6 03/30/2014   MCV 89 03/30/2014   PLT 112 (L) 03/30/2014    BMET    Component Value Date/Time   NA 137 03/30/2014 1100   K 4.2 03/30/2014 1100   CL 101 03/30/2014 1100   CO2 27 03/30/2014 1100   GLUCOSE 94 03/30/2014 1100   BUN 16 02/04/2016 0840   BUN 27 (H) 03/30/2014 1100   CREATININE 1.09 02/04/2016 0840   CREATININE 1.29 03/30/2014 1100   CALCIUM 8.9 03/30/2014 1100   GFRNONAA >60 02/04/2016 0840   GFRNONAA 57 (L) 03/30/2014 1100   GFRAA >60 02/04/2016 0840   GFRAA >60 03/30/2014 1100   CrCl cannot be calculated (Patient's most recent lab  result is older than the maximum 21 days allowed.).  COAG Lab Results  Component Value Date   INR 2.04 02/04/2016   INR 1.9 03/30/2014   INR 2.0 01/24/2013    Radiology No results found.  Assessment/Plan 1. Peripheral artery disease (HCC) Recommend:  Patient should undergo arterial duplex of the lower extremity ASAP because there has been a significant deterioration in the patient's lower extremity symptoms.  The patient states they are having increased pain and a marked decrease in the distance that they can walk.  The risks and benefits as well as the alternatives were discussed in detail with the patient.  All questions were answered.  Patient agrees to proceed and understands this could be a prelude to angiography and intervention.  The patient will follow up with me in the office to review the studies.   - VAS US LOWER EXTREMITY ARTERIAL DUPLEX; Future - VAS US ABI WITH/WO TBI; Future  2. Pain in both lower extremities I am concerned that his change in lower extremity symptoms is secondary to DJD o the back.  I will obtain noninvasive studies to exclude worsening of his ASO  3. Essential hypertension Continue antihypertensive medications as already ordered, these medications have been reviewed and there are no changes at this time.   4. Hyperlipidemia, unspecified hyperlipidemia type Continue statin as ordered and reviewed, no changes at this time   5. DJD (degenerative joint disease), lumbosacral I defer to Dr Terance HartBronstein further workup  Also physical therapy should work with him to see if he needs to begin using a walker    Levora DredgeGregory Parsa Rickett, MD  04/29/2017 9:18 AM

## 2017-05-06 ENCOUNTER — Ambulatory Visit (INDEPENDENT_AMBULATORY_CARE_PROVIDER_SITE_OTHER): Payer: Medicare Other | Admitting: Vascular Surgery

## 2017-05-06 ENCOUNTER — Other Ambulatory Visit (INDEPENDENT_AMBULATORY_CARE_PROVIDER_SITE_OTHER): Payer: Self-pay

## 2017-05-06 ENCOUNTER — Encounter (INDEPENDENT_AMBULATORY_CARE_PROVIDER_SITE_OTHER): Payer: Self-pay | Admitting: Vascular Surgery

## 2017-05-06 ENCOUNTER — Encounter (INDEPENDENT_AMBULATORY_CARE_PROVIDER_SITE_OTHER): Payer: Self-pay

## 2017-05-06 VITALS — BP 118/64 | HR 60 | Resp 16

## 2017-05-06 DIAGNOSIS — M79605 Pain in left leg: Secondary | ICD-10-CM | POA: Diagnosis not present

## 2017-05-06 DIAGNOSIS — I10 Essential (primary) hypertension: Secondary | ICD-10-CM

## 2017-05-06 DIAGNOSIS — M79604 Pain in right leg: Secondary | ICD-10-CM | POA: Diagnosis not present

## 2017-05-06 DIAGNOSIS — T82392S Other mechanical complication of femoral arterial graft (bypass), sequela: Secondary | ICD-10-CM | POA: Diagnosis not present

## 2017-05-06 DIAGNOSIS — I739 Peripheral vascular disease, unspecified: Secondary | ICD-10-CM

## 2017-05-06 DIAGNOSIS — E785 Hyperlipidemia, unspecified: Secondary | ICD-10-CM

## 2017-05-07 DIAGNOSIS — T82392S Other mechanical complication of femoral arterial graft (bypass), sequela: Secondary | ICD-10-CM | POA: Insufficient documentation

## 2017-05-07 NOTE — Progress Notes (Signed)
MRN : [id number]  John Rodgers is a 71 y.o. (1945/11/03) male who presents with chief complaint of  Chief Complaint  Patient presents with  . Follow-up    ultrasound results  .  History of Present Illness:The patient returns to the office for followup and review of the noninvasive studies. There has been a significant deterioration in the lower extremity symptoms.  The patient notes interval shortening of their claudication distance and development of mild rest pain symptoms. No new ulcers or wounds have occurred since the last visit.  There have been no significant changes to the patient's overall health care.  The patient denies amaurosis fugax or recent TIA symptoms. There are no recent neurological changes noted. The patient denies history of DVT, PE or superficial thrombophlebitis. The patient denies recent episodes of angina or shortness of breath.   ABI's Rt=0.96 and Lt=[place]  Duplex US of the lower extremity arterial system shows The left lower extremity bypass is patent however the velocity in the mid to distal bypass is 18 cm/s which is pre-thrombotic  Current Meds  Medication Sig  . aspirin EC 81 MG tablet Take 1 tablet (81 mg total) by mouth daily.  Marland Kitchen. CALCIUM PO Take by mouth.  . Cholecalciferol (D3-50) 50000 units capsule Take by mouth.  . donepezil (ARICEPT) 5 MG tablet Take by mouth.  . furosemide (LASIX) 20 MG tablet   . HYDROcodone-acetaminophen (NORCO/VICODIN) 5-325 MG tablet Take by mouth.  . metoprolol succinate (TOPROL-XL) 25 MG 24 hr tablet Take 25 mg by mouth daily.  . metoprolol tartrate (LOPRESSOR) 25 MG tablet   . ramipril (ALTACE) 1.25 MG capsule Take 1.25 mg by mouth daily.  . simvastatin (ZOCOR) 10 MG tablet Take 10 mg by mouth daily.  Marland Kitchen. warfarin (COUMADIN) 5 MG tablet Take by mouth.    Past Medical History:  Diagnosis Date  . CHF (congestive heart failure) (HCC)   . Dysrhythmia   . GERD (gastroesophageal reflux disease)   . Hypertension   .  Neuromuscular disorder (HCC)   . Peripheral vascular disease [facility](HCC)     Past Surgical History:  Procedure Laterality Date  . PERIPHERAL VASCULAR CATHETERIZATION Left 02/04/2016   Procedure: Lower Extremity Angiography;  Surgeon: Renford DillsGregory G Fenton Candee, MD;  Location: [facility] INVASIVE CV LAB;  Service: Cardiovascular;  Laterality: Left;  . PERIPHERAL VASCULAR CATHETERIZATION  02/04/2016   Procedure: Lower Extremity Intervention;  Surgeon: Renford DillsGregory G Arjay Jaskiewicz, MD;  Location: [facility] INVASIVE CV LAB;  Service: Cardiovascular;;    Social History Social History  Substance Use Topics  . Smoking status: Former Games developermoker  . Smokeless tobacco: Never Used  . Alcohol use No    Family History No family history on file.  No Known Allergies   REVIEW OF SYSTEMS (Negative unless checked)  Constitutional: [] Weight loss  [] Fever  [] Chills Cardiac: [] Chest pain   [] Chest pressure   [] Palpitations   [] Shortness of breath when laying flat   [] Shortness of breath with exertion. Vascular:  [x] Pain in legs with walking   [] Pain in legs at rest  [] History of DVT   [] Phlebitis   [] Swelling in legs   [] Varicose veins   [] Non-healing ulcers Pulmonary:   [] Uses home oxygen   [] Productive cough   [] Hemoptysis   [] Wheeze  [] COPD   [] Asthma Neurologic:  [] Dizziness   [] Seizures   [] History of stroke   [] History of TIA  [] Aphasia   [] Vissual changes   [] Weakness or numbness in arm   [] Weakness or numbness in leg  Musculoskeletal:   [] Joint swelling   [] Joint pain   [] Low back pain Hematologic:  [] Easy bruising  [] Easy bleeding   [] Hypercoagulable state   [] Anemic Gastrointestinal:  [] Diarrhea   [] Vomiting  [] Gastroesophageal reflux/heartburn   [] Difficulty swallowing. Genitourinary:  [] Chronic kidney disease   [] Difficult urination  [] Frequent urination   [] Blood in urine Skin:  [] Rashes   [] Ulcers  Psychological:  [] History of anxiety   []  History of major depression.  Physical Examination  Vitals:   05/06/17 0843  BP: 118/64   Pulse: 60  Resp: 16   There is no height or weight on file to calculate BMI. Gen: WD/WN, NAD Head: [place]/AT, No temporalis wasting.  Ear/Nose/Throat: Hearing grossly intact, nares w/o erythema or drainage Eyes: PER, EOMI, sclera nonicteric.  Neck: Supple, no large masses.   Pulmonary:  Good air movement, no audible wheezing bilaterally, no use of accessory muscles.  Cardiac: RRR, no JVD Vascular: Feet cool with 1-2 second capillary refill Vessel Right Left  Radial Palpable Palpable  Popliteal Palpable Not Palpable  PT Not Palpable Not Palpable  DP Trace Palpable Trace Palpable  Gastrointestinal: Non-distended. No guarding/no peritoneal signs.  Musculoskeletal: M/S 5/5 throughout.  No deformity or atrophy.  Neurologic: CN 2-12 intact. Symmetrical.  Speech is fluent. Motor exam as listed above. Psychiatric: Judgment intact, Mood & affect appropriate for pt's clinical situation. Dermatologic: No rashes or ulcers noted.  No changes consistent with cellulitis. Lymph : No lichenification or skin changes of chronic lymphedema.  CBC Lab Results  Component Value Date   WBC 6.0 03/30/2014   HGB 14.8 03/30/2014   HCT 42.6 03/30/2014   MCV 89 03/30/2014   PLT 112 (L) 03/30/2014    BMET    Component Value Date/Time   NA 137 03/30/2014 1100   K 4.2 03/30/2014 1100   CL 101 03/30/2014 1100   CO2 27 03/30/2014 1100   GLUCOSE 94 03/30/2014 1100   BUN 16 02/04/2016 0840   BUN 27 (H) 03/30/2014 1100   CREATININE 1.09 02/04/2016 0840   CREATININE 1.29 03/30/2014 1100   CALCIUM 8.9 03/30/2014 1100   GFRNONAA >60 02/04/2016 0840   GFRNONAA 57 (L) 03/30/2014 1100   GFRAA >60 02/04/2016 0840   GFRAA >60 03/30/2014 1100   CrCl cannot be calculated (Patient's most recent lab result is older than the maximum 21 days allowed.).  COAG Lab Results  Component Value Date   INR 2.04 02/04/2016   INR 1.9 03/30/2014   INR 2.0 01/24/2013    Radiology No results found.  Assessment/Plan 1.  Peripheral artery disease (HCC) Recommend:  The patient has evidence of severe atherosclerotic changes of both lower extremities with rest pain that is associated with preulcerative changes and impending tissue loss of the foot.  This represents a limb threatening ischemia and places the patient at the risk for limb loss.  Patient should undergo angiography of the lower extremities with the hope for intervention for limb salvage.  The risks and benefits as well as the alternative therapies was discussed in detail with the patient.  All questions were answered.  Patient agrees to proceed with angiography.  The patient will follow up with me in the office after the procedure.    2. 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 Essential hypertension Continue antihypertensive medications as already ordered, these medications have been reviewed and there are no changes at this time.  4. Pain in both lower extremities See #1  5.  Hyperlipidemia, unspecified hyperlipidemia type Continue statin as ordered and reviewed, no changes at this time     Levora Dredge, MD  05/07/2017 3:18 PM

## 2017-05-10 ENCOUNTER — Encounter
Admission: RE | Admit: 2017-05-10 | Discharge: 2017-05-10 | Disposition: A | Discharge status: Home / Self Care | Payer: Medicare Other | Source: Ambulatory Visit | Attending: Vascular Surgery | Admitting: Vascular Surgery

## 2017-05-10 DIAGNOSIS — I739 Peripheral vascular disease, unspecified: Secondary | ICD-10-CM | POA: Diagnosis not present

## 2017-05-10 DIAGNOSIS — E785 Hyperlipidemia, unspecified: Secondary | ICD-10-CM | POA: Diagnosis not present

## 2017-05-10 DIAGNOSIS — K219 Gastro-esophageal reflux disease without esophagitis: Secondary | ICD-10-CM | POA: Diagnosis not present

## 2017-05-10 DIAGNOSIS — Z9889 Other specified postprocedural states: Secondary | ICD-10-CM | POA: Diagnosis not present

## 2017-05-10 DIAGNOSIS — I509 Heart failure, unspecified: Secondary | ICD-10-CM | POA: Diagnosis not present

## 2017-05-10 DIAGNOSIS — Y832 Surgical operation with anastomosis, bypass or graft as the cause of abnormal reaction of the patient, or of later complication, without mention of misadventure at the time of the procedure: Secondary | ICD-10-CM | POA: Diagnosis not present

## 2017-05-10 DIAGNOSIS — I110 Hypertensive heart disease with heart failure: Secondary | ICD-10-CM | POA: Diagnosis not present

## 2017-05-10 DIAGNOSIS — Z87891 Personal history of nicotine dependence: Secondary | ICD-10-CM | POA: Diagnosis not present

## 2017-05-10 DIAGNOSIS — T82858A Stenosis of vascular prosthetic devices, implants and grafts, initial encounter: Secondary | ICD-10-CM | POA: Diagnosis present

## 2017-05-10 DIAGNOSIS — G709 Myoneural disorder, unspecified: Secondary | ICD-10-CM | POA: Diagnosis not present

## 2017-05-10 HISTORY — DX: Unspecified visual loss: H54.7

## 2017-05-10 HISTORY — DX: Cerebral infarction, unspecified: I63.9

## 2017-05-10 HISTORY — DX: Unspecified convulsions: R56.9

## 2017-05-10 HISTORY — DX: Diarrhea, unspecified: R19.7

## 2017-05-10 LAB — CBC
HCT: 40.3 % (ref 40.0–52.0)
HEMOGLOBIN: 13.9 g/dL (ref 13.0–18.0)
MCH: 31.1 pg (ref 26.0–34.0)
MCHC: 34.5 g/dL (ref 32.0–36.0)
MCV: 89.9 fL (ref 80.0–100.0)
PLATELETS: 148 10*3/uL — AB (ref 150–440)
RBC: 4.48 MIL/uL (ref 4.40–5.90)
RDW: 13.5 % (ref 11.5–14.5)
WBC: 5.9 10*3/uL (ref 3.8–10.6)

## 2017-05-10 LAB — CREATININE, SERUM
CREATININE: 1.19 mg/dL (ref 0.61–1.24)
GFR calc non Af Amer: 60 mL/min — ABNORMAL LOW (ref >60)

## 2017-05-10 LAB — BUN: BUN: 28 mg/dL — ABNORMAL HIGH (ref 6–20)

## 2017-05-10 MED ORDER — CEFAZOLIN SODIUM-DEXTROSE 1-4 GM/50ML-% IV SOLN: 1.0000 g | Freq: Once | INTRAVENOUS | Status: DC

## 2017-05-10 NOTE — Pre-Procedure Instructions (Addendum)
PATIENT ARRIVED AT [facility] N/[facility]. STATES STARTED ON WAY HERE. DID EAT THIS AM. HAS HAD DIARRHEA FOR SEVERAL DAYS AND WAS GIVEN MEDICATION TO STOP IT AT HOME HE STAYS IN/ TC TO [facility] TO SEE IF DR SCHNIER WOULD LIKE ADDITIONAL LABS INSTEAD OF JUST BUN/CREAT. WAITING ON CALL BACK DR SCHNIER WANTS CBC PLUS BUN/CREAT. SPOKE WITH NURSE AT [facility] AND SENT BACK NOTE RE ABOVE

## 2017-05-11 ENCOUNTER — Encounter
Admission: RE | Disposition: A | Discharge status: Home / Self Care | Payer: Self-pay | Source: Ambulatory Visit | Attending: Vascular Surgery

## 2017-05-11 ENCOUNTER — Ambulatory Visit
Admission: RE | Admit: 2017-05-11 | Discharge: 2017-05-11 | Disposition: A | Discharge status: Home / Self Care | Payer: Medicare Other | Source: Ambulatory Visit | Attending: Vascular Surgery | Admitting: Vascular Surgery

## 2017-05-11 DIAGNOSIS — E785 Hyperlipidemia, unspecified: Secondary | ICD-10-CM | POA: Insufficient documentation

## 2017-05-11 DIAGNOSIS — Z9889 Other specified postprocedural states: Secondary | ICD-10-CM | POA: Insufficient documentation

## 2017-05-11 DIAGNOSIS — T82858A Stenosis of vascular prosthetic devices, implants and grafts, initial encounter: Secondary | ICD-10-CM | POA: Insufficient documentation

## 2017-05-11 DIAGNOSIS — I70213 Atherosclerosis of native arteries of extremities with intermittent claudication, bilateral legs: Secondary | ICD-10-CM | POA: Diagnosis not present

## 2017-05-11 DIAGNOSIS — Z87891 Personal history of nicotine dependence: Secondary | ICD-10-CM | POA: Insufficient documentation

## 2017-05-11 DIAGNOSIS — I110 Hypertensive heart disease with heart failure: Secondary | ICD-10-CM | POA: Insufficient documentation

## 2017-05-11 DIAGNOSIS — G709 Myoneural disorder, unspecified: Secondary | ICD-10-CM | POA: Insufficient documentation

## 2017-05-11 DIAGNOSIS — I739 Peripheral vascular disease, unspecified: Secondary | ICD-10-CM | POA: Insufficient documentation

## 2017-05-11 DIAGNOSIS — I509 Heart failure, unspecified: Secondary | ICD-10-CM | POA: Insufficient documentation

## 2017-05-11 DIAGNOSIS — K219 Gastro-esophageal reflux disease without esophagitis: Secondary | ICD-10-CM | POA: Insufficient documentation

## 2017-05-11 DIAGNOSIS — Y832 Surgical operation with anastomosis, bypass or graft as the cause of abnormal reaction of the patient, or of later complication, without mention of misadventure at the time of the procedure: Secondary | ICD-10-CM | POA: Insufficient documentation

## 2017-05-11 HISTORY — PX: LOWER EXTREMITY INTERVENTION: CATH118252

## 2017-05-11 HISTORY — PX: LOWER EXTREMITY ANGIOGRAPHY: CATH118251

## 2017-05-11 SURGERY — LOWER EXTREMITY ANGIOGRAPHY
Anesthesia: Moderate Sedation | Laterality: Left

## 2017-05-11 MED ORDER — SODIUM CHLORIDE 0.9 % IV SOLN
INTRAVENOUS | Status: AC | PRN
  Administered 2017-05-11: 250 mL via INTRAVENOUS

## 2017-05-11 MED ORDER — METOPROLOL TARTRATE 5 MG/5ML IV SOLN: 2.0000 mg | INTRAVENOUS | Status: DC | PRN

## 2017-05-11 MED ORDER — PHENOL 1.4 % MT LIQD
1.0000 | OROMUCOSAL | Status: DC | PRN
  Filled 2017-05-11: qty 177

## 2017-05-11 MED ORDER — MIDAZOLAM HCL 2 MG/2ML IJ SOLN
INTRAMUSCULAR | Status: DC | PRN
  Administered 2017-05-11: 2 mg via INTRAVENOUS

## 2017-05-11 MED ORDER — HEPARIN SODIUM (PORCINE) 1000 UNIT/ML IJ SOLN
INTRAMUSCULAR | Status: DC | PRN
Start: 2017-05-11 — End: 2017-05-11
  Administered 2017-05-11: 4000 [IU] via INTRAVENOUS

## 2017-05-11 MED ORDER — HYDROMORPHONE HCL 1 MG/ML IJ SOLN: 0.5000 mg | INTRAMUSCULAR | Status: DC | PRN

## 2017-05-11 MED ORDER — LIDOCAINE HCL (PF) 1 % IJ SOLN
INTRAMUSCULAR | Status: AC
  Filled 2017-05-11: qty 30

## 2017-05-11 MED ORDER — SODIUM CHLORIDE 0.9 % IV SOLN: INTRAVENOUS | Status: DC

## 2017-05-11 MED ORDER — SODIUM CHLORIDE 0.9 % IV SOLN: 1.0000 mL/kg/h | INTRAVENOUS | Status: DC

## 2017-05-11 MED ORDER — FENTANYL CITRATE (PF) 100 MCG/2ML IJ SOLN
INTRAMUSCULAR | Status: DC | PRN
  Administered 2017-05-11: 50 ug via INTRAVENOUS
  Administered 2017-05-11: 25 ug via INTRAVENOUS

## 2017-05-11 MED ORDER — ACETAMINOPHEN 325 MG RE SUPP
325.0000 mg | RECTAL | Status: DC | PRN
  Filled 2017-05-11: qty 2

## 2017-05-11 MED ORDER — FENTANYL CITRATE (PF) 100 MCG/2ML IJ SOLN
INTRAMUSCULAR | Status: AC
  Filled 2017-05-11: qty 2

## 2017-05-11 MED ORDER — OXYCODONE HCL 5 MG PO TABS: 5.0000 mg | ORAL_TABLET | ORAL | Status: DC | PRN

## 2017-05-11 MED ORDER — CEFAZOLIN SODIUM-DEXTROSE 1-4 GM/50ML-% IV SOLN
INTRAVENOUS | Status: AC
  Filled 2017-05-11: qty 50

## 2017-05-11 MED ORDER — LABETALOL HCL 5 MG/ML IV SOLN: 10.0000 mg | INTRAVENOUS | Status: DC | PRN

## 2017-05-11 MED ORDER — ALUM & MAG HYDROXIDE-SIMETH 200-200-20 MG/5ML PO SUSP: 15.0000 mL | ORAL | Status: DC | PRN

## 2017-05-11 MED ORDER — SODIUM CHLORIDE 0.9 % IV SOLN: 500.0000 mL | Freq: Once | INTRAVENOUS | Status: DC | PRN

## 2017-05-11 MED ORDER — MIDAZOLAM HCL 5 MG/5ML IJ SOLN
INTRAMUSCULAR | Status: AC
  Filled 2017-05-11: qty 5

## 2017-05-11 MED ORDER — HEPARIN (PORCINE) IN NACL 2-0.9 UNIT/ML-% IJ SOLN
INTRAMUSCULAR | Status: AC
  Filled 2017-05-11: qty 1000

## 2017-05-11 MED ORDER — HEPARIN SODIUM (PORCINE) 1000 UNIT/ML IJ SOLN
INTRAMUSCULAR | Status: AC
  Filled 2017-05-11: qty 1

## 2017-05-11 MED ORDER — ONDANSETRON HCL 4 MG/2ML IJ SOLN: 4.0000 mg | Freq: Four times a day (QID) | INTRAMUSCULAR | Status: DC | PRN

## 2017-05-11 MED ORDER — ACETAMINOPHEN 325 MG PO TABS: 325.0000 mg | ORAL_TABLET | ORAL | Status: DC | PRN

## 2017-05-11 MED ORDER — HYDRALAZINE HCL 20 MG/ML IJ SOLN: 5.0000 mg | INTRAMUSCULAR | Status: DC | PRN

## 2017-05-11 MED ORDER — GUAIFENESIN-DM 100-10 MG/5ML PO SYRP
15.0000 mL | ORAL_SOLUTION | ORAL | Status: DC | PRN
  Filled 2017-05-11: qty 15

## 2017-05-11 MED ORDER — IOPAMIDOL (ISOVUE-300) INJECTION 61%
INTRAVENOUS | Status: DC | PRN
  Administered 2017-05-11: 50 mL via INTRA_ARTERIAL

## 2017-05-11 SURGICAL SUPPLY — 21 items
BALLN LUTONIX DCB 4X60X130 (BALLOONS) ×4
BALLN LUTONIX DCB 6X80X130 (BALLOONS) ×4
BALLN LUTONIX DCB 7X40X130 (BALLOONS) ×4
BALLN LUTONIX DCB 7X60X130 (BALLOONS) ×4
BALLOON LUTONIX DCB 4X60X130 (BALLOONS) ×2 IMPLANT
BALLOON LUTONIX DCB 6X80X130 (BALLOONS) ×2 IMPLANT
BALLOON LUTONIX DCB 7X40X130 (BALLOONS) ×2 IMPLANT
BALLOON LUTONIX DCB 7X60X130 (BALLOONS) ×2 IMPLANT
CATH IMAGER II S 5FR 65CM (MISCELLANEOUS) ×4 IMPLANT
CATH VERT 5FR 125CM (CATHETERS) ×4 IMPLANT
DEVICE PRESTO INFLATION (MISCELLANEOUS) ×4 IMPLANT
DEVICE STARCLOSE SE CLOSURE (Vascular Products) ×4 IMPLANT
GLIDEWIRE ANGLED SS 035X260CM (WIRE) ×4 IMPLANT
NEEDLE ENTRY 21GA 7CM ECHOTIP (NEEDLE) ×4 IMPLANT
PACK ANGIOGRAPHY (CUSTOM PROCEDURE TRAY) ×4 IMPLANT
SET INTRO CAPELLA COAXIAL (SET/KITS/TRAYS/PACK) ×4 IMPLANT
SHEATH ANL2 6FRX45 HC (SHEATH) ×4 IMPLANT
SHEATH BRITE TIP 5FRX11 (SHEATH) ×4 IMPLANT
SHIELD X-DRAPE GOLD 12X17 (MISCELLANEOUS) ×4 IMPLANT
WIRE HI TORQ VERSACORE 300 (WIRE) ×4 IMPLANT
WIRE J 3MM .035X145CM (WIRE) ×4 IMPLANT

## 2017-05-11 NOTE — H&P (Signed)
[facility] History & Physical Update  The patient was interviewed and re-examined.  The patient's previous History and Physical has been reviewed and is unchanged.  There is no change in the plan of care. We plan to proceed with the scheduled procedure.  Levora DredgeGregory Kaleiah Kutzer, MD  05/11/2017, 10:03 AM

## 2017-05-11 NOTE — Op Note (Signed)
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 On noninvasive studies he has a greater than 70% stenosis identified in his left external iliac and pre-thrombotic flow in his bypass graft putting his bypass in jeopardy  Post-operative diagnosis: Same  Procedure(s) Performed: 1. Introduction catheter into left lower extremity 3rd order catheter placement  2. Contrast injection left lower extremity for distal runoff with additional 3rd order  3. Percutaneous transluminal angioplasty left anterior tibial bypass to 4 mm using a Lutonix drug-eluting balloon              4. Percutaneous transluminal angioplasty left external iliacartery to 7 mm with a Lutonix drug-eluting balloon  5. Percutaneous transluminal angioplasty right external iliac artery with a 7 mm Lutonix drug-eluting balloon             6.  Star close closure right common femoral arteriotomy  Anesthesia: Conscious sedation was administered under my direct supervision by the interventional radiology RN. IV Versed plus fentanyl were utilized. Continuous ECG, pulse oximetry and blood pressure was monitored throughout the entire procedure.  Conscious sedation was for a total of 53 minutes.  Sheath: 6 [place] Ansell right common femoral artery  Contrast: 50 cc  Fluoroscopy Time: 6.9 minutes  Indications: John Rodgers presents with stricture of the left lower extremity arterial system and pre-thrombotic slow flow in the left vein bypass. This places him at risk of losing his bypass and subsequent limb loss. The risks and benefits  are reviewed all questions answered patient agrees to proceed.  Procedure:John Rodgers is a 71 y.o. y.o. male who was identified and appropriate procedural time out was performed. The patient was then placed supine on the table and prepped and draped in the usual sterile fashion.   Ultrasound was placed in the sterile sleeve and the right groin was evaluated the right common femoral artery was echolucent and pulsatile indicating patency. Image was recorded for the permanent record and under real-time visualization a microneedle was inserted into the common femoral artery followed by the microwire and then the micro-sheath. A J-wire was then advanced through the micro-sheath and a 5 [place] sheath was then inserted over a J-wire. J-wire was then advanced and a 5 French pigtail catheter was positioned at the level of T12.  AP projection of the aorta was then obtained. Pigtail catheter was repositioned to above the bifurcation and a RAO view of the pelvis was obtained. Subsequently a VCF catheter with the stiff angle Glidewire was used to cross the aortic bifurcation the catheter wire were advanced down into the left distal external iliac artery. Oblique view of the femoral bifurcation was then obtained and subsequently the wire was reintroduced and the pigtail catheter negotiated into the SFA representing third order catheter placement. Distal runoff was then performed.  5000 units of heparin was then given and allowed to circulate and a 6 [place] Ansell sheath was advanced up and over the bifurcation and positioned in the femoral artery  Straight catheter and stiff angle Glidewire were then negotiated down into the distal popliteal. Catheter was then advanced. Hand injection contrast demonstrated the distal bypass and the tibial anatomy in detail.  In a RAO oblique projection the vein bypass extending from the distal femoropopliteal to the anterior  tibial appears to have a 70% narrowing at its origin. A 4  x 60 Lutonix drug eluting balloon was used to angioplasty the vein bypass to the anterior tibial.  The inflation was for 1 minutes at 8 atm. Follow-up imaging demonstrated excellent patency with less than 5% residual stenosis and preservation of the distal runoff.  The detector was then repositioned and the left external iliac artery was re-imaged.   70% stenosis is noted in the distal portion and a 70% stenosis was noted in the mid portion within the previously placed stent. A 6 x 60 Lutonix drug-eluting balloon was then advanced across the lesion and inflated to 10 atm. Subsequent leg, a 7 x 6 Lutonix drug-eluting balloon was advanced across this area and again inflated to 10 atm for 2 full minutes.Follow-up imaging demonstrated patency with excellent result and less than 5% residual stenosis. Distal runoff was then reassessed and noted to be widely patent.  The sheath was then pulled back into the right iliac systemand injection of contrast was performed which demonstrated a focal lesion in the mid right external iliac artery of approximately 80%. This correlated with the place the wire hung up on in the beginning of the case. A 7 x 4 Lutonix balloon was then advanced across this lesion inflated to 10 atm for 2 full minutes. Follow-up imaging demonstrated complete resolution of the stricture with less than 5% residual stenosis.   After review of these images the sheath is pulled into the right distal external iliac and oblique of the common femoral is obtained and a Star close device deployed. There no immediate Complications.  Findings: The abdominal aorta is opacified with a bolus injection contrast. Renal arteries are patent. The aorta itself has diffuse disease but no hemodynamically significant lesions. The common and external iliac arteries are widely patent bilaterally.  The left common femoral is widely patent as is the profunda femoris.  Postoperative changes are noted in the common femoral.  There is a large vein bypass associated with the common femoral which demonstrates very sluggish flow on initial injection. SFA is occluded as is the popliteal until the popliteals distal few centimeters where the primary bypass demonstrates its distal anastomosis. This femoral popliteal vein bypass is widely patent with a widely patent distal anastomosis to the peroneal. There is a jump graft extending from the distal anastomotic area to the anterior tibial which demonstrates a stenosis at its origin. Following angioplasty to 4 mm there appears to be resolution of this lesion in the bypass graft.  Treatment of the external iliac on the left with a 7 mm balloon inflation also his successful with less 5% residual stenosis. This is also the case in the right external iliac  Summary: Successful recanalization left and right lower extremity for prevention of loss of bypass graft and loss of limb  Disposition: Patient was taken to the recovery room in stable condition having tolerated the procedure well.  Belenda Cruise Israel Wunder 05/11/2017,11:56 AM

## 2017-05-12 ENCOUNTER — Encounter: Payer: Self-pay | Admitting: Vascular Surgery

## 2017-05-25 ENCOUNTER — Ambulatory Visit (INDEPENDENT_AMBULATORY_CARE_PROVIDER_SITE_OTHER): Payer: Medicare Other

## 2017-05-25 DIAGNOSIS — I739 Peripheral vascular disease, unspecified: Secondary | ICD-10-CM | POA: Diagnosis not present

## 2017-06-10 ENCOUNTER — Ambulatory Visit (INDEPENDENT_AMBULATORY_CARE_PROVIDER_SITE_OTHER): Payer: Medicare Other | Admitting: Vascular Surgery

## 2017-06-10 ENCOUNTER — Encounter (INDEPENDENT_AMBULATORY_CARE_PROVIDER_SITE_OTHER): Payer: Self-pay | Admitting: Vascular Surgery

## 2017-06-10 VITALS — BP 123/69 | HR 73 | Resp 15 | Ht 70.0 in | Wt 171.0 lb

## 2017-06-10 DIAGNOSIS — I739 Peripheral vascular disease, unspecified: Secondary | ICD-10-CM

## 2017-06-10 DIAGNOSIS — E785 Hyperlipidemia, unspecified: Secondary | ICD-10-CM | POA: Diagnosis not present

## 2017-06-10 DIAGNOSIS — I10 Essential (primary) hypertension: Secondary | ICD-10-CM | POA: Diagnosis not present

## 2017-06-10 NOTE — Progress Notes (Signed)
MRN : [id number]   John Rodgers is a 71 y.o. (Oct 23, 1945) male who presents with chief complaint of  Chief Complaint  Patient presents with  . Routine Post Op    2 week post op  .  History of Present Illness:   The patient returns to the office for followup and review of studies status post angiogram with intervention on 05/11/2017. Patient tolerated angiogram well. Denies any adverse events occurred as a result of the angiogram or in the immediate post op period. The patient notes no significant changes in the lower extremity pain. He reports he has hx of arthritis in his back and is aware that this may also be contributing to some of his pain. He reports his legs hurt and ache constantly. Elevating the legs at night seem to improve the pain. He does have a hx of BLE swelling for which he wears compression stockings. These stockings successful prevent/reduce his BLE swelling. Denies any interval shortening of the patient's claudication distance or rest pain symptoms. Denies having any wounds prior to the angio. Also denies new ulcers or wounds have occurred since the last visit. He denies any excessive bleeding, bruising, or pain at the puncture site.   There have been no significant changes to the patient's overall health care.  The patient denies history of DVT, PE or superficial thrombophlebitis. The patient denies recent episodes of angina or shortness of breath.   ABI's  Were conducted on 06/04/2017 these studies reveal Rt= resting 1.11 and Lt=resting- 1.98 with normal bilateral toe-brachial indices  Previous studies obtained 04/29/2017 reveal ABI's Rt=0.96 and Lt=[place], the duplex U/S of the lower extremity arterial system shows that the left lower extremity bypass was patent but there were velocities in the mid/distal bypass of 18 cm/s which was considered pre-thrombotic   As a result of the studies obtained on 04/29/2017 it was recommended that the patient undergo angiography and  subsequent intervention. On 08/14 he had this procedure completed. See op note for 05/11/17 for specific details. In summary he had successful recanalization left and right lower extremity. A 7 mm balloon was utilized to reduce stenosis of the bilateral external iliac arteries which was successful as it resulted in <5% residual stenosis.   Current Meds  Medication Sig  . aspirin EC 81 MG tablet Take 1 tablet (81 mg total) by mouth daily.  Marland Kitchen CALCIUM PO Take 1 tablet by mouth 2 (two) times daily. 0800 & 2100  . donepezil (ARICEPT) 10 MG tablet Take 10 mg by mouth daily.  . furosemide (LASIX) 20 MG tablet Take 20 mg by mouth daily.   . ramipril (ALTACE) 1.25 MG capsule Take 1.25 mg by mouth daily.  . simvastatin (ZOCOR) 10 MG tablet Take 10 mg by mouth at bedtime.   . tamsulosin (FLOMAX) 0.4 MG CAPS capsule Take 0.4 mg by mouth daily.  . Vitamin D, Ergocalciferol, (DRISDOL) 50000 units CAPS capsule Take 50,000 Units by mouth every 30 (thirty) days. On the 5th of the month  . warfarin (COUMADIN) 5 MG tablet Take 5 mg by mouth at bedtime.     Past Medical History:  Diagnosis Date  . CHF (congestive heart failure) (HCC)   . Diarrhea    SEVERAL DAYS/ GIVEN MED TO STOP AT HOME HE LIVES IN  . Dysrhythmia   . GERD (gastroesophageal reflux disease)   . Hypertension   . Neuromuscular disorder (HCC)   . Peripheral vascular disease (HCC)   . Seizures (HCC)  x1 years ago no longer on anti seizure medicines  . Stroke [facility])    more weakness left side  . Vision loss    bil tunnel vision    Past Surgical History:  Procedure Laterality Date  . LOWER EXTREMITY ANGIOGRAPHY Left 05/11/2017   Procedure: Lower Extremity Angiography;  Surgeon: Renford Dills, MD;  Location: [facility] INVASIVE CV LAB;  Service: Cardiovascular;  Laterality: Left;  . LOWER EXTREMITY INTERVENTION  05/11/2017   Procedure: Lower Extremity Intervention;  Surgeon: Renford Dills, MD;  Location: [facility] INVASIVE CV LAB;  Service:  Cardiovascular;;  . PERIPHERAL VASCULAR CATHETERIZATION Left 02/04/2016   Procedure: Lower Extremity Angiography;  Surgeon: Renford Dills, MD;  Location: [facility] INVASIVE CV LAB;  Service: Cardiovascular;  Laterality: Left;  . PERIPHERAL VASCULAR CATHETERIZATION  02/04/2016   Procedure: Lower Extremity Intervention;  Surgeon: Renford Dills, MD;  Location: [facility] INVASIVE CV LAB;  Service: Cardiovascular;;    Social History Social History  Substance Use Topics  . Smoking status: Former Games developer  . Smokeless tobacco: Never Used  . Alcohol use No    Family History No family history on file.  No Known Allergies   REVIEW OF SYSTEMS (Negative unless checked)  Constitutional: Weight loss  Fever  Chills Cardiac: Chest pain   Chest pressure   Palpitations   Shortness of breath when laying flat   Shortness of breath with exertion. Vascular:  Pain in legs with walking   Pain in legs at rest  History of DVT   Phlebitis   Swelling in legs   Varicose veins   Non-healing ulcers Pulmonary:   Uses home oxygen   Productive cough   Hemoptysis   Wheeze  COPD   Asthma Neurologic:  Dizziness   Seizures   History of stroke   History of TIA  Aphasia   Facial droop  Visual changes   Weakness or numbness in arm   Weakness or numbness in leg Musculoskeletal:   Joint swelling   Joint pain   Low back pain Hematologic:  Easy bruising  Easy bleeding   Hypercoagulable state   Anemic Gastrointestinal:  Diarrhea   Vomiting   Abdominal pain  Difficulty swallowing.   Blood in stool Genitourinary:  Chronic kidney disease   Difficult urination  Frequent urination   Blood in urine Skin:  Rashes   Ulcers  Psychological:  History of anxiety    History of major depression.  Physical Examination  Vitals:   06/10/17 0903  BP: 123/69  Pulse: 73  Resp: 15  Weight: 171 lb (77.6 kg)  Height:  (1.778 m)   Body  mass index is 24.54 kg/m. Gen: WD/WN, NAD Head: [place]/AT, No temporalis wasting.  Ear/Nose/Throat: Hearing grossly impaired, wears bilateral hearing aids, nares w/o erythema or drainage Eyes: PER, EOMI, sclera nonicteric.   Pulmonary:  Good air movement, no use of accessory muscles.  Cardiac: RRR, no JVD Vascular:  Vessel Right Left  Radial +2 Palpable +2 Palpable  Ulnar    Brachial    Carotid    Femoral    Popliteal    PT +1 Palpable +1 Palpable  DP +2 Palpable +2 Palpable  Minimal amount of lower extremity swelling  Neurologic: Alert and oriented x3. Facial movements symmetrical.  Speech is fluent.  Psychiatric: Judgment intact, Mood & affect appropriate for pt's clinical situation. Dermatologic: No rashes or ulcers noted.  No changes consistent with cellulitis or stasis dermatitis. Lymph : No lichenification or skin changes of chronic  lymphedema.  CBC Lab Results  Component Value Date   WBC 5.9 05/10/2017   HGB 13.9 05/10/2017   HCT 40.3 05/10/2017   MCV 89.9 05/10/2017   PLT 148 (L) 05/10/2017    BMET    Component Value Date/Time   NA 137 03/30/2014 1100   K 4.2 03/30/2014 1100   CL 101 03/30/2014 1100   CO2 27 03/30/2014 1100   GLUCOSE 94 03/30/2014 1100   BUN 28 (H) 05/10/2017 1052   BUN 27 (H) 03/30/2014 1100   CREATININE 1.19 05/10/2017 1052   CREATININE 1.29 03/30/2014 1100   CALCIUM 8.9 03/30/2014 1100   GFRNONAA 60 (L) 05/10/2017 1052   GFRNONAA 57 (L) 03/30/2014 1100   GFRAA >60 05/10/2017 1052   GFRAA >60 03/30/2014 1100   CrCl cannot be calculated (Patient's most recent lab result is older than the maximum 21 days allowed.).  COAG Lab Results  Component Value Date   INR 2.04 02/04/2016   INR 1.9 03/30/2014   INR 2.0 01/24/2013    Radiology See ABIs obtained on 05/25/2017 under the media tab for detailed information, these studies have been reviewed by myself and Dr. Gilda CreaseSchnier.   Assessment/Plan  1. PAD s/p angiography   The patient is  status post successful angiogram with intervention.  While continues to have leg pain this is likely more orthopedic in nature. The ABIs obtained today, as well as the findings on the physical exam, reflect that BLEs are receiving good arterial blood flow.   No further invasive studies, angiography or surgery at this time The patient should continue walking and begin a more formal exercise program.  The patient should continue antiplatelet therapy and aggressive treatment of the lipid abnormalities  The patient should continue wearing graduated compression socks 10-15 mmHg strength to control the mild edema.  Patient should undergo noninvasive studies as ordered. The patient will follow up with me after the studies.    2. Essential Hypertension  Continue antihypertensive medications as currently ordered, these medications have been reviewed and there are no changes at this time.  3. Hyperlipidemia   Continue statin as currently prescribed. Current medication dose reviewed, no changes at this time  -Red flags and when to present for emergency care or RTC including chest pain, shortness of breath, new/worsening/un-resolving symptoms, development of non-healing wounds, or ischemic leg symptoms reviewed with patient at time of visit. Follow up and care instructions discussed and patient verbalized understanding.  I have discussed this patient with Dr. Gilda CreaseSchnier who agrees with this plan of care.   Clarene DukeBrittany M Aashrith Eves, NP  06/10/2017 9:32 AM

## 2017-06-10 NOTE — Patient Instructions (Signed)
Peripheral Vascular Disease Peripheral vascular disease (PVD) is a disease of the blood vessels that are not part of your heart and brain. A simple term for PVD is poor circulation. In most cases, PVD narrows the blood vessels that carry blood from your heart to the rest of your body. This can result in a decreased supply of blood to your arms, legs, and internal organs, like your stomach or kidneys. However, it most often affects a person's lower legs and feet. There are two types of PVD.  Organic PVD. This is the more common type. It is caused by damage to the structure of blood vessels.  Functional PVD. This is caused by conditions that make blood vessels contract and tighten (spasm).  Without treatment, PVD tends to get worse over time. PVD can also lead to acute ischemic limb. This is when an arm or limb suddenly has trouble getting enough blood. This is a medical emergency. Follow these instructions at home:  Take medicines only as told by your doctor.  Do not use any tobacco products, including cigarettes, chewing tobacco, or electronic cigarettes. If you need help quitting, ask your doctor.  Lose weight if you are overweight, and maintain a healthy weight as told by your doctor.  Eat a diet that is low in fat and cholesterol. If you need help, ask your doctor.  Exercise regularly. Ask your doctor for some good activities for you.  Take good care of your feet. ? Wear comfortable shoes that fit well. ? Check your feet often for any cuts or sores. Contact a doctor if:  You have cramps in your legs while walking.  You have leg pain when you are at rest.  You have coldness in a leg or foot.  Your skin changes.  You are unable to get or have an erection (erectile dysfunction).  You have cuts or sores on your feet that are not healing. Get help right away if:  Your arm or leg turns cold and blue.  Your arms or legs become red, warm, swollen, painful, or numb.  You have  chest pain or trouble breathing.  You suddenly have weakness in your face, arm, or leg.  You become very confused or you cannot speak.  You suddenly have a very bad headache.  You suddenly cannot see. This information is not intended to replace advice given to you by your health care provider. Make sure you discuss any questions you have with your health care provider. Document Released: 12/09/2009 Document Revised: 02/20/2016 Document Reviewed: 02/22/2014 Elsevier Interactive Patient Education  2017 Elsevier Inc.  

## 2017-07-12 ENCOUNTER — Ambulatory Visit (INDEPENDENT_AMBULATORY_CARE_PROVIDER_SITE_OTHER): Payer: Medicare Other

## 2017-07-12 ENCOUNTER — Encounter (INDEPENDENT_AMBULATORY_CARE_PROVIDER_SITE_OTHER): Payer: Self-pay | Admitting: Vascular Surgery

## 2017-07-12 ENCOUNTER — Ambulatory Visit (INDEPENDENT_AMBULATORY_CARE_PROVIDER_SITE_OTHER): Payer: Medicare Other | Admitting: Vascular Surgery

## 2017-07-12 VITALS — BP 152/76 | HR 65 | Resp 18 | Ht 67.0 in | Wt 170.0 lb

## 2017-07-12 DIAGNOSIS — I70213 Atherosclerosis of native arteries of extremities with intermittent claudication, bilateral legs: Secondary | ICD-10-CM | POA: Diagnosis not present

## 2017-07-12 DIAGNOSIS — M47817 Spondylosis without myelopathy or radiculopathy, lumbosacral region: Secondary | ICD-10-CM

## 2017-07-12 DIAGNOSIS — I70219 Atherosclerosis of native arteries of extremities with intermittent claudication, unspecified extremity: Secondary | ICD-10-CM | POA: Insufficient documentation

## 2017-07-12 DIAGNOSIS — T82392S Other mechanical complication of femoral arterial graft (bypass), sequela: Secondary | ICD-10-CM

## 2017-07-12 DIAGNOSIS — M79605 Pain in left leg: Secondary | ICD-10-CM

## 2017-07-12 DIAGNOSIS — M79604 Pain in right leg: Secondary | ICD-10-CM | POA: Diagnosis not present

## 2017-07-12 DIAGNOSIS — I739 Peripheral vascular disease, unspecified: Secondary | ICD-10-CM

## 2017-07-12 DIAGNOSIS — M5137 Other intervertebral disc degeneration, lumbosacral region: Secondary | ICD-10-CM | POA: Diagnosis not present

## 2017-07-12 NOTE — Progress Notes (Signed)
MRN : [id number]  John Rodgers is a 71 y.o. ([birth year]) male who presents with chief complaint of follow up for leg blockages.  History of Present Illness: The patient returns to the office for followup and review status post angiogram with intervention on 05/11/2017.   Procedure(s) Performed: 1. Introduction catheter into left lower extremity 3rd order catheter placement  2. Contrast injection left lower extremity for distal runoff with additional 3rd order  3. Percutaneous transluminal angioplasty left anterior tibial bypass to 4 mm using a Lutonix drug-eluting balloon              4. Percutaneous transluminal angioplasty left external iliacartery to 7 mm with a Lutonix drug-eluting balloon  5. Percutaneous transluminal angioplasty right external iliac artery with a 7 mm Lutonix drug-eluting balloon             6.  Star close closure right common femoral arteriotomy  The patient notes improvement in the lower extremity symptoms. No interval shortening of the patient's claudication distance or rest pain symptoms.   No new ulcers or wounds have occurred since the last visit.  There have been no significant changes to the patient's overall health care.  The patient denies amaurosis fugax or recent TIA symptoms. There are no recent neurological changes noted. The patient denies history of DVT, PE or superficial thrombophlebitis. The patient denies recent episodes of angina or shortness of breath.   ABI's Rt=1.38 and Lt=1.45  (previous ABI's Rt=[place] and Lt=1.98) Duplex US of the aorta and iliac arteries shows the stents are widely patent   No outpatient prescriptions have been marked as taking for the 07/12/17 encounter (Appointment) with Gilda Crease, Latina Craver, MD.    Past Medical History:  Diagnosis Date  . CHF (congestive heart failure) (HCC)   . Diarrhea    SEVERAL DAYS/ GIVEN MED TO STOP AT HOME HE LIVES IN  . Dysrhythmia    . GERD (gastroesophageal reflux disease)   . Hypertension   . Neuromuscular disorder (HCC)   . Peripheral vascular disease (HCC)   . Seizures (HCC)    x1 years ago no longer on anti seizure medicines  . Stroke [facility])    more weakness left side  . Vision loss    bil tunnel vision    Past Surgical History:  Procedure Laterality Date  . LOWER EXTREMITY ANGIOGRAPHY Left 05/11/2017   Procedure: Lower Extremity Angiography;  Surgeon: Renford Dills, MD;  Location: [facility] INVASIVE CV LAB;  Service: Cardiovascular;  Laterality: Left;  . LOWER EXTREMITY INTERVENTION  05/11/2017   Procedure: Lower Extremity Intervention;  Surgeon: Renford Dills, MD;  Location: [facility] INVASIVE CV LAB;  Service: Cardiovascular;;  . PERIPHERAL VASCULAR CATHETERIZATION Left 02/04/2016   Procedure: Lower Extremity Angiography;  Surgeon: Renford Dills, MD;  Location: [facility] INVASIVE CV LAB;  Service: Cardiovascular;  Laterality: Left;  . PERIPHERAL VASCULAR CATHETERIZATION  02/04/2016   Procedure: Lower Extremity Intervention;  Surgeon: Renford Dills, MD;  Location: [facility] INVASIVE CV LAB;  Service: Cardiovascular;;    Social History Social History  Substance Use Topics  . Smoking status: Former Games developer  . Smokeless tobacco: Never Used  . Alcohol use No    Family History No family history on file.  No Known Allergies   REVIEW OF SYSTEMS (Negative unless checked)  Constitutional: Weight loss  Fever  Chills Cardiac: Chest pain   Chest pressure   Palpitations   Shortness of breath when laying flat   Shortness of breath with  exertion. Vascular:  Pain in legs with walking   Pain in legs at rest  History of DVT   Phlebitis   Swelling in legs   Varicose veins   Non-healing ulcers Pulmonary:   Uses home oxygen   Productive cough   Hemoptysis   Wheeze  COPD   Asthma Neurologic:  Dizziness   Seizures   History of stroke   History of TIA  Aphasia   Vissual  changes   Weakness or numbness in arm   Weakness or numbness in leg Musculoskeletal:   Joint swelling   Joint pain   Low back pain Hematologic:  Easy bruising  Easy bleeding   Hypercoagulable state   Anemic Gastrointestinal:  Diarrhea   Vomiting  Gastroesophageal reflux/heartburn   Difficulty swallowing. Genitourinary:  Chronic kidney disease   Difficult urination  Frequent urination   Blood in urine Skin:  Rashes   Ulcers  Psychological:  History of anxiety    History of major depression.  Physical Examination  There were no vitals filed for this visit. There is no height or weight on file to calculate BMI. Gen: WD/WN, NAD Head: [place]/AT, No temporalis wasting.  Ear/Nose/Throat: Hearing grossly intact, nares w/o erythema or drainage Eyes: PER, EOMI, sclera nonicteric.  Neck: Supple, no large masses.   Pulmonary:  Good air movement, no audible wheezing bilaterally, no use of accessory muscles.  Cardiac: RRR, no JVD Vascular:  Vessel Right Left  Radial Palpable Palpable  PT Not Palpable Not Palpable  DP Not Palpable Trace Palpable  Gastrointestinal: Non-distended. No guarding/no peritoneal signs.  Musculoskeletal: M/S 5/5 throughout.  No deformity or atrophy.  Neurologic: CN 2-12 intact. Symmetrical.  Speech is fluent. Motor exam as listed above. Psychiatric: Judgment intact, Mood & affect appropriate for pt's clinical situation. Dermatologic: No rashes or ulcers noted.  No changes consistent with cellulitis. Lymph : No lichenification or skin changes of chronic lymphedema.  CBC Lab Results  Component Value Date   WBC 5.9 05/10/2017   HGB 13.9 05/10/2017   HCT 40.3 05/10/2017   MCV 89.9 05/10/2017   PLT 148 (L) 05/10/2017    BMET    Component Value Date/Time   NA 137 03/30/2014 1100   K 4.2 03/30/2014 1100   CL 101 03/30/2014 1100   CO2 27 03/30/2014 1100   GLUCOSE 94 03/30/2014 1100   BUN 28 (H) 05/10/2017 1052   BUN 27 (H)  03/30/2014 1100   CREATININE 1.19 05/10/2017 1052   CREATININE 1.29 03/30/2014 1100   CALCIUM 8.9 03/30/2014 1100   GFRNONAA 60 (L) 05/10/2017 1052   GFRNONAA 57 (L) 03/30/2014 1100   GFRAA >60 05/10/2017 1052   GFRAA >60 03/30/2014 1100   CrCl cannot be calculated (Patient's most recent lab result is older than the maximum 21 days allowed.).  COAG Lab Results  Component Value Date   INR 2.04 02/04/2016   INR 1.9 03/30/2014   INR 2.0 01/24/2013    Radiology No results found.   Assessment/Plan 1. Atherosclerosis of native artery of both lower extremities with intermittent claudication (HCC)  Recommend:  The patient has evidence of atherosclerosis of the lower extremities with claudication.  The patient does not voice lifestyle limiting changes at this point in time.  Noninvasive studies do not suggest clinically significant change.  No invasive studies, angiography or surgery at this time The patient should continue walking and begin a more formal exercise program.  The patient should continue antiplatelet therapy and aggressive treatment of the lipid  abnormalities  No changes in the patient's medications at this time  The patient should continue wearing graduated compression socks 10-15 mmHg strength to control the mild edema.    2. 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 DJD (degenerative joint disease), lumbosacral Continue NSAID medications as already ordered, these medications have been reviewed and there are no changes at this time.   4. Pain in both lower extremities This is a combination of arterial disease and his lower back    Levora Dredge, MD  07/12/2017 9:47 AM

## 2017-08-02 ENCOUNTER — Other Ambulatory Visit: Payer: Self-pay | Admitting: Nurse Practitioner

## 2017-08-02 DIAGNOSIS — H53461 Homonymous bilateral field defects, right side: Secondary | ICD-10-CM

## 2017-08-02 DIAGNOSIS — I6930 Unspecified sequelae of cerebral infarction: Secondary | ICD-10-CM

## 2017-08-09 ENCOUNTER — Ambulatory Visit
Admission: RE | Admit: 2017-08-09 | Discharge: 2017-08-09 | Disposition: A | Discharge status: Home / Self Care | Payer: Medicare Other | Source: Ambulatory Visit | Attending: Nurse Practitioner | Admitting: Nurse Practitioner

## 2017-08-09 DIAGNOSIS — G319 Degenerative disease of nervous system, unspecified: Secondary | ICD-10-CM | POA: Diagnosis not present

## 2017-08-09 DIAGNOSIS — H53461 Homonymous bilateral field defects, right side: Secondary | ICD-10-CM | POA: Insufficient documentation

## 2017-08-09 DIAGNOSIS — I6930 Unspecified sequelae of cerebral infarction: Secondary | ICD-10-CM | POA: Diagnosis present

## 2017-08-09 DIAGNOSIS — H0520 Unspecified exophthalmos: Secondary | ICD-10-CM | POA: Diagnosis not present

## 2017-08-09 DIAGNOSIS — F0150 Vascular dementia without behavioral disturbance: Secondary | ICD-10-CM | POA: Diagnosis not present

## 2017-12-09 ENCOUNTER — Encounter (INDEPENDENT_AMBULATORY_CARE_PROVIDER_SITE_OTHER): Payer: Medicare Other

## 2017-12-09 ENCOUNTER — Ambulatory Visit (INDEPENDENT_AMBULATORY_CARE_PROVIDER_SITE_OTHER): Payer: Medicare Other | Admitting: Vascular Surgery

## 2017-12-21 ENCOUNTER — Encounter: Payer: Self-pay | Admitting: *Deleted

## 2017-12-28 ENCOUNTER — Ambulatory Visit: Payer: Medicare Other | Admitting: Certified Registered Nurse Anesthetist

## 2017-12-28 ENCOUNTER — Encounter
Admission: RE | Disposition: A | Discharge status: Home / Self Care | Payer: Self-pay | Source: Ambulatory Visit | Attending: Ophthalmology

## 2017-12-28 ENCOUNTER — Encounter: Payer: Self-pay | Admitting: *Deleted

## 2017-12-28 ENCOUNTER — Ambulatory Visit
Admission: RE | Admit: 2017-12-28 | Discharge: 2017-12-28 | Disposition: A | Discharge status: Home / Self Care | Payer: Medicare Other | Source: Ambulatory Visit | Attending: Ophthalmology | Admitting: Ophthalmology

## 2017-12-28 DIAGNOSIS — F0150 Vascular dementia without behavioral disturbance: Secondary | ICD-10-CM | POA: Diagnosis not present

## 2017-12-28 DIAGNOSIS — I110 Hypertensive heart disease with heart failure: Secondary | ICD-10-CM | POA: Diagnosis not present

## 2017-12-28 DIAGNOSIS — I252 Old myocardial infarction: Secondary | ICD-10-CM | POA: Diagnosis not present

## 2017-12-28 DIAGNOSIS — E7800 Pure hypercholesterolemia, unspecified: Secondary | ICD-10-CM | POA: Insufficient documentation

## 2017-12-28 DIAGNOSIS — I509 Heart failure, unspecified: Secondary | ICD-10-CM | POA: Insufficient documentation

## 2017-12-28 DIAGNOSIS — I453 Trifascicular block: Secondary | ICD-10-CM | POA: Insufficient documentation

## 2017-12-28 DIAGNOSIS — H9193 Unspecified hearing loss, bilateral: Secondary | ICD-10-CM | POA: Insufficient documentation

## 2017-12-28 DIAGNOSIS — G43909 Migraine, unspecified, not intractable, without status migrainosus: Secondary | ICD-10-CM | POA: Diagnosis not present

## 2017-12-28 DIAGNOSIS — H2512 Age-related nuclear cataract, left eye: Secondary | ICD-10-CM | POA: Diagnosis present

## 2017-12-28 DIAGNOSIS — Z950 Presence of cardiac pacemaker: Secondary | ICD-10-CM | POA: Insufficient documentation

## 2017-12-28 DIAGNOSIS — Z87891 Personal history of nicotine dependence: Secondary | ICD-10-CM | POA: Insufficient documentation

## 2017-12-28 DIAGNOSIS — M1990 Unspecified osteoarthritis, unspecified site: Secondary | ICD-10-CM | POA: Diagnosis not present

## 2017-12-28 DIAGNOSIS — M810 Age-related osteoporosis without current pathological fracture: Secondary | ICD-10-CM | POA: Diagnosis not present

## 2017-12-28 DIAGNOSIS — H538 Other visual disturbances: Secondary | ICD-10-CM | POA: Diagnosis not present

## 2017-12-28 DIAGNOSIS — I4891 Unspecified atrial fibrillation: Secondary | ICD-10-CM | POA: Insufficient documentation

## 2017-12-28 DIAGNOSIS — K219 Gastro-esophageal reflux disease without esophagitis: Secondary | ICD-10-CM | POA: Insufficient documentation

## 2017-12-28 HISTORY — DX: Acute myocardial infarction, unspecified: I21.9

## 2017-12-28 HISTORY — DX: Cardiac murmur, unspecified: R01.1

## 2017-12-28 HISTORY — DX: Headache: R51

## 2017-12-28 HISTORY — DX: Presence of cardiac pacemaker: Z95.0

## 2017-12-28 HISTORY — DX: Headache, unspecified: R51.9

## 2017-12-28 HISTORY — DX: Unspecified hearing loss, unspecified ear: H91.90

## 2017-12-28 HISTORY — DX: Unspecified dementia, unspecified severity, without behavioral disturbance, psychotic disturbance, mood disturbance, and anxiety: F03.90

## 2017-12-28 HISTORY — DX: Unspecified osteoarthritis, unspecified site: M19.90

## 2017-12-28 HISTORY — DX: Atherosclerotic heart disease of native coronary artery without angina pectoris: I25.10

## 2017-12-28 HISTORY — PX: CATARACT EXTRACTION W/PHACO: SHX586

## 2017-12-28 SURGERY — PHACOEMULSIFICATION, CATARACT, WITH IOL INSERTION
Anesthesia: Monitor Anesthesia Care | Site: Eye | Laterality: Left | Wound class: "Clean "

## 2017-12-28 MED ORDER — LIDOCAINE HCL (PF) 4 % IJ SOLN
INTRAOCULAR | Status: DC | PRN
  Administered 2017-12-28: 4 mL via OPHTHALMIC

## 2017-12-28 MED ORDER — SODIUM CHLORIDE 0.9 % IV SOLN
INTRAVENOUS | Status: DC
  Administered 2017-12-28: 10:00:00 via INTRAVENOUS

## 2017-12-28 MED ORDER — ARMC OPHTHALMIC DILATING DROPS
1.0000 "application " | OPHTHALMIC | Status: AC
  Administered 2017-12-28 (×3): 1 via OPHTHALMIC

## 2017-12-28 MED ORDER — MOXIFLOXACIN HCL 0.5 % OP SOLN: 1.0000 [drp] | OPHTHALMIC | Status: DC | PRN

## 2017-12-28 MED ORDER — LIDOCAINE HCL (PF) 4 % IJ SOLN
INTRAMUSCULAR | Status: AC
  Filled 2017-12-28: qty 5

## 2017-12-28 MED ORDER — CARBACHOL 0.01 % IO SOLN
INTRAOCULAR | Status: DC | PRN
  Administered 2017-12-28: 0.5 mL via INTRAOCULAR

## 2017-12-28 MED ORDER — DEXMEDETOMIDINE HCL 200 MCG/2ML IV SOLN
INTRAVENOUS | Status: DC | PRN
  Administered 2017-12-28 (×2): 4 ug via INTRAVENOUS

## 2017-12-28 MED ORDER — NA CHONDROIT SULF-NA HYALURON 40-17 MG/ML IO SOLN
INTRAOCULAR | Status: DC | PRN
  Administered 2017-12-28: 1 mL via INTRAOCULAR

## 2017-12-28 MED ORDER — NA CHONDROIT SULF-NA HYALURON 40-17 MG/ML IO SOLN
INTRAOCULAR | Status: AC
  Filled 2017-12-28: qty 1

## 2017-12-28 MED ORDER — ARMC OPHTHALMIC DILATING DROPS
OPHTHALMIC | Status: AC
  Administered 2017-12-28: 1 via OPHTHALMIC
  Filled 2017-12-28: qty 0.4

## 2017-12-28 MED ORDER — POVIDONE-IODINE 5 % OP SOLN
OPHTHALMIC | Status: AC
  Filled 2017-12-28: qty 30

## 2017-12-28 MED ORDER — EPINEPHRINE PF 1 MG/ML IJ SOLN
INTRAOCULAR | Status: DC | PRN
  Administered 2017-12-28: 11:00:00 via OPHTHALMIC

## 2017-12-28 MED ORDER — EPINEPHRINE PF 1 MG/ML IJ SOLN
INTRAMUSCULAR | Status: AC
  Filled 2017-12-28: qty 2

## 2017-12-28 MED ORDER — MOXIFLOXACIN HCL 0.5 % OP SOLN
OPHTHALMIC | Status: AC
  Filled 2017-12-28: qty 3

## 2017-12-28 MED ORDER — MOXIFLOXACIN HCL 0.5 % OP SOLN
OPHTHALMIC | Status: DC | PRN
  Administered 2017-12-28: 0.2 mL via OPHTHALMIC

## 2017-12-28 MED ORDER — POVIDONE-IODINE 5 % OP SOLN
OPHTHALMIC | Status: DC | PRN
  Administered 2017-12-28: 1 via OPHTHALMIC

## 2017-12-28 SURGICAL SUPPLY — 16 items
GLOVE BIO SURGEON STRL SZ8 (GLOVE) ×3 IMPLANT
GLOVE BIOGEL M 6.5 STRL (GLOVE) ×3 IMPLANT
GLOVE SURG LX 8.0 MICRO (GLOVE) ×2
GLOVE SURG LX STRL 8.0 MICRO (GLOVE) ×1 IMPLANT
GOWN STRL REUS W/ TWL LRG LVL3 (GOWN DISPOSABLE) ×2 IMPLANT
GOWN STRL REUS W/TWL LRG LVL3 (GOWN DISPOSABLE) ×4
LABEL CATARACT MEDS ST (LABEL) ×3 IMPLANT
LENS IOL TECNIS ITEC 20.5 (Intraocular Lens) ×2 IMPLANT
PACK CATARACT (MISCELLANEOUS) ×3 IMPLANT
PACK CATARACT BRASINGTON LX (MISCELLANEOUS) ×3 IMPLANT
PACK EYE AFTER SURG (MISCELLANEOUS) ×3 IMPLANT
SOL BSS BAG (MISCELLANEOUS) ×3
SOLUTION BSS BAG (MISCELLANEOUS) ×1 IMPLANT
SYR 5ML LL (SYRINGE) ×3 IMPLANT
WATER STERILE IRR 250ML POUR (IV SOLUTION) ×3 IMPLANT
WIPE NON LINTING 3.25X3.25 (MISCELLANEOUS) ×3 IMPLANT

## 2017-12-28 NOTE — H&P (Signed)
All labs reviewed. Abnormal studies sent to patients PCP when indicated.  Previous H&P reviewed, patient examined, there are NO CHANGES.  Dimitrios Balestrieri Porfilio4/2/201910:47 AM

## 2017-12-28 NOTE — Op Note (Signed)
PREOPERATIVE DIAGNOSIS:  Nuclear sclerotic cataract of the left eye.   POSTOPERATIVE DIAGNOSIS:  Nuclear sclerotic cataract of the left eye.   OPERATIVE PROCEDURE: Procedure(s): CATARACT EXTRACTION PHACO AND INTRAOCULAR LENS PLACEMENT ([facility])   SURGEON:  Galen ManilaWilliam Philopater Mucha, MD.   ANESTHESIA:  Anesthesiologist: Christia ReadingHowell, Scott T, MD CRNA: Dava NajjarFrazier, Susan, CRNA  1.      Managed anesthesia care. 2.     0.511ml of Shugarcaine was instilled following the paracentesis   COMPLICATIONS:  None.   TECHNIQUE:   Stop and chop   DESCRIPTION OF PROCEDURE:  The patient was examined and consented in the preoperative holding area where the aforementioned topical anesthesia was applied to the left eye and then brought back to the Operating Room where the left eye was prepped and draped in the usual sterile ophthalmic fashion and a lid speculum was placed. A paracentesis was created with the side port blade and the anterior chamber was filled with viscoelastic. A near clear corneal incision was performed with the steel keratome. A continuous curvilinear capsulorrhexis was performed with a cystotome followed by the capsulorrhexis forceps. Hydrodissection and hydrodelineation were carried out with BSS on a blunt cannula. The lens was removed in a stop and chop  technique and the remaining cortical material was removed with the irrigation-aspiration handpiece. The capsular bag was inflated with viscoelastic and the Technis [id number] lens was placed in the capsular bag without complication. The remaining viscoelastic was removed from the eye with the irrigation-aspiration handpiece. The wounds were hydrated. The anterior chamber was flushed with Miostat and the eye was inflated to physiologic pressure. 0.811ml Vigamox was placed in the anterior chamber. The wounds were found to be water tight. The eye was dressed with Vigamox. The patient was given protective glasses to wear throughout the day and a shield with which to sleep  tonight. The patient was also given drops with which to begin a drop regimen today and will follow-up with me in one day. Implant Name Type Inv. Item Serial No. Manufacturer Lot No. LRB No. Used  LENS IOL DIOP 20.5 - [id number] Intraocular Lens LENS IOL DIOP 20.5 [id number] AMO  Left 1    Procedure(s) with comments: CATARACT EXTRACTION PHACO AND INTRAOCULAR LENS PLACEMENT ([facility]) (Left) - US 00:17.6 AP% 12.7 CDE 2.24 Fluid Pack LOt # [id number]  Electronically signed: Galen ManilaWilliam Jovanka Westgate 12/28/2017 11:13 AM

## 2017-12-28 NOTE — Anesthesia Postprocedure Evaluation (Signed)
Anesthesia Post Note  Patient: Layla Maw  Procedure(s) Performed: CATARACT EXTRACTION PHACO AND INTRAOCULAR LENS PLACEMENT ([place]) (Left Eye)  Patient location during evaluation: Endoscopy Anesthesia Type: MAC Level of consciousness: awake and alert Pain management: pain level controlled Vital Signs Assessment: post-procedure vital signs reviewed and stable Respiratory status: spontaneous breathing, nonlabored ventilation and respiratory function stable Cardiovascular status: blood pressure returned to baseline and stable Postop Assessment: no apparent nausea or vomiting Anesthetic complications: no     Last Vitals:  Vitals:   12/28/17 0946 12/28/17 1114  BP: (!) 157/75 (!) 146/59  Pulse: 61 60  Resp: 16 16  Temp: 36.8 C (!) 36.3 C  SpO2: 100% 100%    Last Pain:  Vitals:   12/28/17 1114  TempSrc: Tympanic  PainSc: 0-No pain                 Alphonsus Sias

## 2017-12-28 NOTE — Discharge Instructions (Signed)
Eye Surgery Discharge Instructions  Expect mild scratchy sensation or mild soreness. DO NOT RUB YOUR EYE!  The day of surgery:  Minimal physical activity, but bed rest is not required  No reading, computer work, or close hand work  No bending, lifting, or straining.  May watch TV  For 24 hours:  No driving, legal decisions, or alcoholic beverages  Safety precautions  Eat anything you prefer: It is better to start with liquids, then soup then solid foods.  _____ Eye patch should be worn until postoperative exam tomorrow.  ____ Solar shield eyeglasses should be worn for comfort in the sunlight/patch while sleeping  Resume all regular medications including aspirin or Coumadin if these were discontinued prior to surgery. You may shower, bathe, shave, or wash your hair. Tylenol may be taken for mild discomfort.  Call your doctor if you experience significant pain, nausea, or vomiting, fever > 101 or other signs of infection. [phone] or [phone] Specific instructions:  Follow-up Information    John Rodgers, William, MD Follow up in 1 day(s).   Specialty:  Ophthalmology Why:  12/29/2017 @ 9:20 Contact information: [address] [place] [id number] [phone]

## 2017-12-28 NOTE — Anesthesia Preprocedure Evaluation (Signed)
Anesthesia Evaluation  Patient identified by MRN, date of birth, ID band Patient awake    Reviewed: Allergy & Precautions, H&P , NPO status , reviewed documented beta blocker date and time   Airway Mallampati: II  TM Distance: >3 FB     Dental  (+) Chipped   Pulmonary former smoker,    Pulmonary exam normal        Cardiovascular hypertension, + CAD, + Past MI, + Peripheral Vascular Disease and +CHF  Normal cardiovascular exam+ dysrhythmias + pacemaker + Valvular Problems/Murmurs      Neuro/Psych  Headaches, Seizures -,  PSYCHIATRIC DISORDERS Dementia  Neuromuscular disease CVA    GI/Hepatic GERD  Controlled,  Endo/Other    Renal/GU      Musculoskeletal  (+) Arthritis ,   Abdominal   Peds  Hematology   Anesthesia Other Findings   Reproductive/Obstetrics                             Anesthesia Physical Anesthesia Plan  ASA: IV  Anesthesia Plan: MAC   Post-op Pain Management:    Induction:   PONV Risk Score and Plan: 1 and Propofol infusion  Airway Management Planned:   Additional Equipment:   Intra-op Plan:   Post-operative Plan:   Informed Consent: I have reviewed the patients History and Physical, chart, labs and discussed the procedure including the risks, benefits and alternatives for the proposed anesthesia with the patient or authorized representative who has indicated his/her understanding and acceptance.   Dental Advisory Given  Plan Discussed with: CRNA  Anesthesia Plan Comments:         Anesthesia Quick Evaluation

## 2017-12-28 NOTE — Transfer of Care (Signed)
Immediate Anesthesia Transfer of Care Note  Patient: John Rodgers  Procedure(s) Performed: CATARACT EXTRACTION PHACO AND INTRAOCULAR LENS PLACEMENT ([place]) (Left Eye)  Patient Location: Short Stay  Anesthesia Type:MAC  Level of Consciousness: awake, alert , oriented and patient cooperative  Airway & Oxygen Therapy: Patient Spontanous Breathing  Post-op Assessment: Report given to RN and Post -op Vital signs reviewed and stable  Post vital signs: Reviewed and stable  Last Vitals:  Vitals Value Taken Time  BP    Temp    Pulse    Resp    SpO2      Last Pain:  Vitals:   12/28/17 0946  TempSrc: Oral         Complications: No apparent anesthesia complications

## 2017-12-28 NOTE — Anesthesia Post-op Follow-up Note (Signed)
Anesthesia QCDR form completed.        

## 2018-01-17 ENCOUNTER — Ambulatory Visit (INDEPENDENT_AMBULATORY_CARE_PROVIDER_SITE_OTHER): Payer: Medicare Other

## 2018-01-17 ENCOUNTER — Encounter (INDEPENDENT_AMBULATORY_CARE_PROVIDER_SITE_OTHER): Payer: Self-pay | Admitting: Vascular Surgery

## 2018-01-17 ENCOUNTER — Ambulatory Visit (INDEPENDENT_AMBULATORY_CARE_PROVIDER_SITE_OTHER): Payer: Medicare Other | Admitting: Vascular Surgery

## 2018-01-17 VITALS — BP 124/68 | HR 65 | Resp 13 | Ht 70.0 in | Wt 174.0 lb

## 2018-01-17 DIAGNOSIS — I739 Peripheral vascular disease, unspecified: Secondary | ICD-10-CM

## 2018-01-17 DIAGNOSIS — I70213 Atherosclerosis of native arteries of extremities with intermittent claudication, bilateral legs: Secondary | ICD-10-CM | POA: Diagnosis not present

## 2018-01-17 DIAGNOSIS — E785 Hyperlipidemia, unspecified: Secondary | ICD-10-CM

## 2018-01-17 DIAGNOSIS — I6529 Occlusion and stenosis of unspecified carotid artery: Secondary | ICD-10-CM | POA: Insufficient documentation

## 2018-01-17 DIAGNOSIS — M5137 Other intervertebral disc degeneration, lumbosacral region: Secondary | ICD-10-CM | POA: Diagnosis not present

## 2018-01-17 DIAGNOSIS — I6521 Occlusion and stenosis of right carotid artery: Secondary | ICD-10-CM | POA: Diagnosis not present

## 2018-01-17 DIAGNOSIS — I10 Essential (primary) hypertension: Secondary | ICD-10-CM

## 2018-01-17 DIAGNOSIS — M47817 Spondylosis without myelopathy or radiculopathy, lumbosacral region: Secondary | ICD-10-CM

## 2018-01-17 NOTE — Progress Notes (Signed)
MRN : [id number]  John Rodgers is a 72 y.o. (08-Oct-1945) male who presents with chief complaint of  Chief Complaint  Patient presents with  . Follow-up    6 month ABI and Aorta iliac  .  History of Present Illness: The patient returns to the office for followup and review status post angiogram with intervention on 05/11/2017.  He was last seen in the office on 06/2017.  Procedure(s) Performed 05/11/2017: 1. Introduction catheter into leftlower extremity 3rd order catheter placement  2.Contrast injection leftlower extremity for distal runoff with additional 3rd order  3. Percutaneous transluminal angioplasty left anterior tibial bypass to 4 mm using a Lutonix drug-eluting balloon 4. Percutaneous transluminal angioplasty left external iliacartery to 7 mm with a Lutonix drug-eluting balloon  5. Percutaneous transluminal angioplasty right external iliac artery with a 7 mm Lutonix drug-eluting balloon 6.Star close closure rightcommon femoral arteriotomy  The patient notes improvement in the lower extremity symptoms. No interval shortening of the patient's claudication distance or rest pain symptoms.   No new ulcers or wounds have occurred since the last visit.  There have been no significant changes to the patient's overall health care.  The patient denies amaurosis fugax or recent TIA symptoms. There are no recent neurological changes noted. The patient denies history of DVT, PE or superficial thrombophlebitis. The patient denies recent episodes of angina or shortness of breath.   ABI's Rt=1.02 and Lt=1.22 AT signals are triphasic bilaterally (previous ABI's Rt=1.38 and Lt=1.45) Duplex US of the aorta and iliac arteries shows the stents are widely patent     Current Meds  Medication Sig  . aspirin EC 81 MG tablet Take 1 tablet (81 mg total) by mouth daily.  . calcium gluconate 500 MG  tablet Take 500 mg by mouth 2 (two) times daily.  Marland Kitchen donepezil (ARICEPT) 5 MG tablet Take 5 mg by mouth at bedtime.  . DUREZOL 0.05 % EMUL   . furosemide (LASIX) 20 MG tablet Take 20 mg by mouth daily.   Marland Kitchen HYDROcodone-acetaminophen (NORCO/VICODIN) 5-325 MG tablet Take 1 tablet by mouth every 6 (six) hours as needed for moderate pain.   Marland Kitchen ILEVRO 0.3 % ophthalmic suspension   . loperamide (IMODIUM A-D) 2 MG tablet Take 4 mg by mouth See admin instructions. Give 2 tablets after first loose stool and 1 tablet after each consecutive stool  . memantine (NAMENDA) 5 MG tablet Take 5 mg by mouth 2 (two) times daily.  . metoprolol tartrate (LOPRESSOR) 25 MG tablet Take 25 mg by mouth 2 (two) times daily. 0800 & 2100  . ramipril (ALTACE) 1.25 MG capsule Take 1.25 mg by mouth daily.  . simvastatin (ZOCOR) 10 MG tablet Take 10 mg by mouth at bedtime.   . tamsulosin (FLOMAX) 0.4 MG CAPS capsule Take 0.4 mg by mouth daily.  . Vitamin D, Ergocalciferol, (DRISDOL) 50000 units CAPS capsule Take 50,000 Units by mouth every 30 (thirty) days. On the 5th of the month  . warfarin (COUMADIN) 5 MG tablet Take 5 mg by mouth at bedtime.     Past Medical History:  Diagnosis Date  . Arthritis   . CHF (congestive heart failure) (HCC)   . Coronary artery disease   . Dementia    VASCULAR  . Diarrhea    SEVERAL DAYS/ GIVEN MED TO STOP AT HOME HE LIVES IN  . Dysrhythmia    AFIB  . GERD (gastroesophageal reflux disease)   . Headache    MIGRAINE  . Heart murmur  SURGICAL REPAIR  . HOH (hard of hearing)   . Hypertension   . Myocardial infarction (HCC)   . Neuromuscular disorder (HCC)    MUSCLE WEAKNESS  . Peripheral vascular disease (HCC)   . Presence of permanent cardiac pacemaker   . Seizures (HCC)    x1 years ago no longer on anti seizure medicines  . Stroke [facility]) 1996   more weakness left side CEREBELLAR STROKE SYNDROME  . Vision loss    bil tunnel vision    Past Surgical History:  Procedure  Laterality Date  . CATARACT EXTRACTION W/PHACO Left 12/28/2017   Procedure: CATARACT EXTRACTION PHACO AND INTRAOCULAR LENS PLACEMENT ([facility]);  Surgeon: Galen Manila, MD;  Location: [facility] ORS;  Service: Ophthalmology;  Laterality: Left;  [place] 00:17.6 AP% 12.7 CDE 2.24 Fluid Pack LOt # [id number]  . INSERT / REPLACE / REMOVE PACEMAKER    . LOWER EXTREMITY ANGIOGRAPHY Left 05/11/2017   Procedure: Lower Extremity Angiography;  Surgeon: Renford Dills, MD;  Location: [facility] INVASIVE CV LAB;  Service: Cardiovascular;  Laterality: Left;  . LOWER EXTREMITY INTERVENTION  05/11/2017   Procedure: Lower Extremity Intervention;  Surgeon: Renford Dills, MD;  Location: [facility] INVASIVE CV LAB;  Service: Cardiovascular;;  . MURMUR     HEART REPAIR  . PERIPHERAL VASCULAR CATHETERIZATION Left 02/04/2016   Procedure: Lower Extremity Angiography;  Surgeon: Renford Dills, MD;  Location: [facility] INVASIVE CV LAB;  Service: Cardiovascular;  Laterality: Left;  . PERIPHERAL VASCULAR CATHETERIZATION  02/04/2016   Procedure: Lower Extremity Intervention;  Surgeon: Renford Dills, MD;  Location: [facility] INVASIVE CV LAB;  Service: Cardiovascular;;  . VASCULAR SURGERY      Social History Social History   Tobacco Use  . Smoking status: Former Games developer  . Smokeless tobacco: Never Used  Substance Use Topics  . Alcohol use: No  . Drug use: No    Family History History reviewed. No pertinent family history.  No Known Allergies   REVIEW OF SYSTEMS (Negative unless checked)  Constitutional: [] Weight loss  [] Fever  [] Chills Cardiac: [] Chest pain   [] Chest pressure   [] Palpitations   [] Shortness of breath when laying flat   [] Shortness of breath with exertion. Vascular:  [x] Pain in legs with walking   [] Pain in legs at rest  [] History of DVT   [] Phlebitis   [] Swelling in legs   [] Varicose veins   [] Non-healing ulcers Pulmonary:   [] Uses home oxygen   [] Productive cough   [] Hemoptysis   [] Wheeze  [] COPD    [] Asthma Neurologic:  [] Dizziness   [] Seizures   [] History of stroke   [] History of TIA  [] Aphasia   [] Vissual changes   [] Weakness or numbness in arm   [] Weakness or numbness in leg Musculoskeletal:   [] Joint swelling   [] Joint pain   [] Low back pain Hematologic:  [] Easy bruising  [] Easy bleeding   [] Hypercoagulable state   [] Anemic Gastrointestinal:  [] Diarrhea   [] Vomiting  [] Gastroesophageal reflux/heartburn   [] Difficulty swallowing. Genitourinary:  [] Chronic kidney disease   [] Difficult urination  [] Frequent urination   [] Blood in urine Skin:  [] Rashes   [] Ulcers  Psychological:  [] History of anxiety   []  History of major depression.  Physical Examination  Vitals:   01/17/18 1049  BP: 124/68  Pulse: 65  Resp: 13  Weight: 174 lb (78.9 kg)  Height: 5\' 10"  (1.778 m)   Body mass index is 24.97 kg/m. Gen: WD/WN, NAD Seen in a wheelchair, frail appearing  Head: [place]/AT, No temporalis wasting.  Ear/Nose/Throat:  Hearing grossly intact, nares w/o erythema or drainage Eyes: PER, EOMI, sclera nonicteric.  Neck: Supple, no large masses.   Pulmonary:  Good air movement, no audible wheezing bilaterally, no use of accessory muscles.  Cardiac: RRR, no JVD Vascular:  Right carotid bruit  Trace edema bilateral ankles Vessel Right Left  Radial Palpable Trace Palpable  PT Not Palpable Not Palpable  DP Not Palpable Not Palpable  Gastrointestinal: Non-distended. No guarding/no peritoneal signs.  Musculoskeletal: M/S 5/5 throughout.  No deformity or atrophy.  Neurologic: CN 2-12 intact. Symmetrical.  Speech is fluent. Motor exam as listed above. Psychiatric: Judgment intact, Mood & affect appropriate for pt's clinical situation. Dermatologic: No rashes or ulcers noted.  No changes consistent with cellulitis. Lymph : No lichenification or skin changes of chronic lymphedema.  CBC Lab Results  Component Value Date   WBC 5.9 05/10/2017   HGB 13.9 05/10/2017   HCT 40.3 05/10/2017   MCV 89.9  05/10/2017   PLT 148 (L) 05/10/2017    BMET    Component Value Date/Time   NA 137 03/30/2014 1100   K 4.2 03/30/2014 1100   CL 101 03/30/2014 1100   CO2 27 03/30/2014 1100   GLUCOSE 94 03/30/2014 1100   BUN 28 (H) 05/10/2017 1052   BUN 27 (H) 03/30/2014 1100   CREATININE 1.19 05/10/2017 1052   CREATININE 1.29 03/30/2014 1100   CALCIUM 8.9 03/30/2014 1100   GFRNONAA 60 (L) 05/10/2017 1052   GFRNONAA 57 (L) 03/30/2014 1100   GFRAA >60 05/10/2017 1052   GFRAA >60 03/30/2014 1100   CrCl cannot be calculated (Patient's most recent lab result is older than the maximum 21 days allowed.).  COAG Lab Results  Component Value Date   INR 2.04 02/04/2016   INR 1.9 03/30/2014   INR 2.0 01/24/2013    Radiology No results found.   Assessment/Plan 1. Peripheral artery disease (HCC)  Recommend:  The patient has evidence of atherosclerosis of the lower extremities with claudication.  The patient does not voice lifestyle limiting changes at this point in time.  Noninvasive studies do not suggest clinically significant change.  No invasive studies, angiography or surgery at this time The patient should continue walking and begin a more formal exercise program.  The patient should continue antiplatelet therapy and aggressive treatment of the lipid abnormalities  No changes in the patient's medications at this time  The patient should continue wearing graduated compression socks 10-15 mmHg strength to control the mild edema.   - VAS US AORTA/IVC/ILIACS; Future - VAS US ABI WITH/WO TBI; Future  2. Stenosis of right carotid artery Recommend:  Given the patient's asymptomatic subcritical stenosis no further invasive testing or surgery at this time.  Continue antiplatelet therapy as prescribed Continue management of CAD, HTN and Hyperlipidemia Healthy heart diet,  encouraged exercise at least 4 times per week  Follow up in 12 months with duplex ultrasound and physical exam   -  VAS US CAROTID; Future  3. Essential hypertension Continue antihypertensive medications as already ordered, these medications have been reviewed and there are no changes at this time.   4. DJD (degenerative joint disease), lumbosacral Continue NSAID medications as already ordered, these medications have been reviewed and there are no changes at this time.  Continued activity and therapy was stressed.   5. Hyperlipidemia, unspecified hyperlipidemia type Continue statin as ordered and reviewed, no changes at this time    Levora DredgeGregory Schnier, MD  01/17/2018 10:56 AM

## 2018-01-20 ENCOUNTER — Encounter: Payer: Self-pay | Admitting: *Deleted

## 2018-01-25 ENCOUNTER — Encounter
Admission: RE | Disposition: A | Discharge status: Home / Self Care | Payer: Self-pay | Source: Ambulatory Visit | Attending: Ophthalmology

## 2018-01-25 ENCOUNTER — Ambulatory Visit: Payer: Medicare Other | Admitting: Anesthesiology

## 2018-01-25 ENCOUNTER — Ambulatory Visit
Admission: RE | Admit: 2018-01-25 | Discharge: 2018-01-25 | Disposition: A | Discharge status: Home / Self Care | Payer: Medicare Other | Source: Ambulatory Visit | Attending: Ophthalmology | Admitting: Ophthalmology

## 2018-01-25 ENCOUNTER — Other Ambulatory Visit: Payer: Self-pay

## 2018-01-25 ENCOUNTER — Encounter: Payer: Self-pay | Admitting: *Deleted

## 2018-01-25 DIAGNOSIS — Z950 Presence of cardiac pacemaker: Secondary | ICD-10-CM | POA: Insufficient documentation

## 2018-01-25 DIAGNOSIS — E7800 Pure hypercholesterolemia, unspecified: Secondary | ICD-10-CM | POA: Diagnosis not present

## 2018-01-25 DIAGNOSIS — K219 Gastro-esophageal reflux disease without esophagitis: Secondary | ICD-10-CM | POA: Insufficient documentation

## 2018-01-25 DIAGNOSIS — I509 Heart failure, unspecified: Secondary | ICD-10-CM | POA: Diagnosis not present

## 2018-01-25 DIAGNOSIS — Z7982 Long term (current) use of aspirin: Secondary | ICD-10-CM | POA: Insufficient documentation

## 2018-01-25 DIAGNOSIS — Z7901 Long term (current) use of anticoagulants: Secondary | ICD-10-CM | POA: Insufficient documentation

## 2018-01-25 DIAGNOSIS — I4891 Unspecified atrial fibrillation: Secondary | ICD-10-CM | POA: Insufficient documentation

## 2018-01-25 DIAGNOSIS — Z7984 Long term (current) use of oral hypoglycemic drugs: Secondary | ICD-10-CM | POA: Insufficient documentation

## 2018-01-25 DIAGNOSIS — I110 Hypertensive heart disease with heart failure: Secondary | ICD-10-CM | POA: Insufficient documentation

## 2018-01-25 DIAGNOSIS — Z8673 Personal history of transient ischemic attack (TIA), and cerebral infarction without residual deficits: Secondary | ICD-10-CM | POA: Insufficient documentation

## 2018-01-25 DIAGNOSIS — F0150 Vascular dementia without behavioral disturbance: Secondary | ICD-10-CM | POA: Insufficient documentation

## 2018-01-25 DIAGNOSIS — H2511 Age-related nuclear cataract, right eye: Secondary | ICD-10-CM | POA: Insufficient documentation

## 2018-01-25 DIAGNOSIS — Z79899 Other long term (current) drug therapy: Secondary | ICD-10-CM | POA: Diagnosis not present

## 2018-01-25 HISTORY — PX: CATARACT EXTRACTION W/PHACO: SHX586

## 2018-01-25 HISTORY — DX: Age-related osteoporosis without current pathological fracture: M81.0

## 2018-01-25 HISTORY — DX: Generalized contraction of visual field, unspecified eye: H53.489

## 2018-01-25 HISTORY — DX: Pure hypercholesterolemia, unspecified: E78.00

## 2018-01-25 SURGERY — PHACOEMULSIFICATION, CATARACT, WITH IOL INSERTION
Anesthesia: Monitor Anesthesia Care | Site: Eye | Laterality: Right | Wound class: Clean

## 2018-01-25 MED ORDER — CARBACHOL 0.01 % IO SOLN
INTRAOCULAR | Status: DC | PRN
  Administered 2018-01-25: 0.5 mL via INTRAOCULAR

## 2018-01-25 MED ORDER — BSS IO SOLN
INTRAOCULAR | Status: DC | PRN
  Administered 2018-01-25: 10:00:00 via OPHTHALMIC

## 2018-01-25 MED ORDER — MIDAZOLAM HCL 2 MG/2ML IJ SOLN
INTRAMUSCULAR | Status: AC
  Filled 2018-01-25: qty 2

## 2018-01-25 MED ORDER — MOXIFLOXACIN HCL 0.5 % OP SOLN
OPHTHALMIC | Status: DC | PRN
  Administered 2018-01-25: 0.2 mL via OPHTHALMIC

## 2018-01-25 MED ORDER — NA CHONDROIT SULF-NA HYALURON 40-17 MG/ML IO SOLN
INTRAOCULAR | Status: AC
  Filled 2018-01-25: qty 1

## 2018-01-25 MED ORDER — ARMC OPHTHALMIC DILATING DROPS
1.0000 "application " | OPHTHALMIC | Status: AC
  Administered 2018-01-25 (×3): 1 via OPHTHALMIC

## 2018-01-25 MED ORDER — FENTANYL CITRATE (PF) 100 MCG/2ML IJ SOLN
INTRAMUSCULAR | Status: AC
  Filled 2018-01-25: qty 2

## 2018-01-25 MED ORDER — ONDANSETRON HCL 4 MG/2ML IJ SOLN: 4.0000 mg | Freq: Once | INTRAMUSCULAR | Status: DC | PRN

## 2018-01-25 MED ORDER — EPINEPHRINE PF 1 MG/ML IJ SOLN
INTRAMUSCULAR | Status: AC
  Filled 2018-01-25: qty 2

## 2018-01-25 MED ORDER — MOXIFLOXACIN HCL 0.5 % OP SOLN: 1.0000 [drp] | OPHTHALMIC | Status: DC | PRN

## 2018-01-25 MED ORDER — MOXIFLOXACIN HCL 0.5 % OP SOLN
OPHTHALMIC | Status: AC
  Filled 2018-01-25: qty 3

## 2018-01-25 MED ORDER — NA CHONDROIT SULF-NA HYALURON 40-17 MG/ML IO SOLN
INTRAOCULAR | Status: DC | PRN
  Administered 2018-01-25: 1 mL via INTRAOCULAR

## 2018-01-25 MED ORDER — FENTANYL CITRATE (PF) 100 MCG/2ML IJ SOLN
INTRAMUSCULAR | Status: DC | PRN
  Administered 2018-01-25: 50 ug via INTRAVENOUS

## 2018-01-25 MED ORDER — FENTANYL CITRATE (PF) 100 MCG/2ML IJ SOLN: 25.0000 ug | INTRAMUSCULAR | Status: DC | PRN

## 2018-01-25 MED ORDER — MIDAZOLAM HCL 2 MG/2ML IJ SOLN
INTRAMUSCULAR | Status: DC | PRN
  Administered 2018-01-25: 1 mg via INTRAVENOUS

## 2018-01-25 MED ORDER — POVIDONE-IODINE 5 % OP SOLN
OPHTHALMIC | Status: AC
  Filled 2018-01-25: qty 30

## 2018-01-25 MED ORDER — LIDOCAINE HCL (PF) 4 % IJ SOLN
INTRAMUSCULAR | Status: AC
  Filled 2018-01-25: qty 5

## 2018-01-25 MED ORDER — LIDOCAINE HCL (PF) 4 % IJ SOLN
INTRAOCULAR | Status: DC | PRN
  Administered 2018-01-25: 4 mL via OPHTHALMIC

## 2018-01-25 MED ORDER — ARMC OPHTHALMIC DILATING DROPS
OPHTHALMIC | Status: AC
  Administered 2018-01-25: 1 via OPHTHALMIC
  Filled 2018-01-25: qty 0.4

## 2018-01-25 MED ORDER — SODIUM CHLORIDE 0.9 % IV SOLN
INTRAVENOUS | Status: DC
  Administered 2018-01-25: 08:00:00 via INTRAVENOUS

## 2018-01-25 MED ORDER — POVIDONE-IODINE 5 % OP SOLN
OPHTHALMIC | Status: DC | PRN
  Administered 2018-01-25: 1 via OPHTHALMIC

## 2018-01-25 SURGICAL SUPPLY — 16 items
GLOVE BIO SURGEON STRL SZ8 (GLOVE) ×3 IMPLANT
GLOVE BIOGEL M 6.5 STRL (GLOVE) ×3 IMPLANT
GLOVE SURG LX 8.0 MICRO (GLOVE) ×2
GLOVE SURG LX STRL 8.0 MICRO (GLOVE) ×1 IMPLANT
GOWN STRL REUS W/ TWL LRG LVL3 (GOWN DISPOSABLE) ×2 IMPLANT
GOWN STRL REUS W/TWL LRG LVL3 (GOWN DISPOSABLE) ×4
LABEL CATARACT MEDS ST (LABEL) ×3 IMPLANT
LENS IOL TECNIS ITEC 20.0 (Intraocular Lens) ×3 IMPLANT
PACK CATARACT (MISCELLANEOUS) ×3 IMPLANT
PACK CATARACT BRASINGTON LX (MISCELLANEOUS) ×3 IMPLANT
PACK EYE AFTER SURG (MISCELLANEOUS) ×3 IMPLANT
SOL BSS BAG (MISCELLANEOUS) ×3
SOLUTION BSS BAG (MISCELLANEOUS) ×1 IMPLANT
SYR 5ML LL (SYRINGE) ×3 IMPLANT
WATER STERILE IRR 250ML POUR (IV SOLUTION) ×3 IMPLANT
WIPE NON LINTING 3.25X3.25 (MISCELLANEOUS) ×3 IMPLANT

## 2018-01-25 NOTE — Transfer of Care (Signed)
Immediate Anesthesia Transfer of Care Note  Patient: John Rodgers  Procedure(s) Performed: CATARACT EXTRACTION PHACO AND INTRAOCULAR LENS PLACEMENT ([facility]) (Right Eye)  Patient Location: PACU  Anesthesia Type:MAC  Level of Consciousness: awake  Airway & Oxygen Therapy: Patient Spontanous Breathing  Post-op Assessment: Report given to RN  Post vital signs: stable  Last Vitals:  Vitals Value Taken Time  BP    Temp    Pulse    Resp    SpO2      Last Pain:  Vitals:   01/25/18 0802  TempSrc: Oral  PainSc: 0-No pain         Complications: No apparent anesthesia complications

## 2018-01-25 NOTE — Anesthesia Post-op Follow-up Note (Signed)
Anesthesia QCDR form completed.        

## 2018-01-25 NOTE — Discharge Instructions (Signed)
Eye Surgery Discharge Instructions  Expect mild scratchy sensation or mild soreness. DO NOT RUB YOUR EYE!  The day of surgery:  Minimal physical activity, but bed rest is not required  No reading, computer work, or close hand work  No bending, lifting, or straining.  May watch TV  For 24 hours:  No driving, legal decisions, or alcoholic beverages  Safety precautions  Eat anything you prefer: It is better to start with liquids, then soup then solid foods.  _____ Eye patch should be worn until postoperative exam tomorrow.  ____ Solar shield eyeglasses should be worn for comfort in the sunlight/patch while sleeping  Resume all regular medications including aspirin or Coumadin if these were discontinued prior to surgery. You may shower, bathe, shave, or wash your hair. Tylenol may be taken for mild discomfort.  Call your doctor if you experience significant pain, nausea, or vomiting, fever > 101 or other signs of infection. [phone] or [phone] Specific instructions:  Follow-up Information    Galen Manila, MD Follow up on 01/26/2018.   Specialty:  Ophthalmology Why:  10:10 Contact information: [address] [place] [address] [phone]

## 2018-01-25 NOTE — H&P (Signed)
All labs reviewed. Abnormal studies sent to patients PCP when indicated.  Previous H&P reviewed, patient examined, there are NO CHANGES.  Glorious Flicker Porfilio4/30/20199:42 AM

## 2018-01-25 NOTE — Anesthesia Postprocedure Evaluation (Signed)
Anesthesia Post Note  Patient: John Rodgers  Procedure(s) Performed: CATARACT EXTRACTION PHACO AND INTRAOCULAR LENS PLACEMENT ([place]) (Right Eye)  Patient location during evaluation: PACU Anesthesia Type: MAC Level of consciousness: awake Pain management: pain level controlled Vital Signs Assessment: post-procedure vital signs reviewed and stable Respiratory status: spontaneous breathing Cardiovascular status: blood pressure returned to baseline Postop Assessment: no apparent nausea or vomiting Anesthetic complications: no     Last Vitals:  Vitals:   01/20/18 1359 01/25/18 0802  BP: 132/62 (!) 148/70  Pulse: 62 (!) 59  Resp:  12  Temp:  (!) 36.3 C  SpO2:  100%    Last Pain:  Vitals:   01/25/18 0802  TempSrc: Oral  PainSc: 0-No pain                 Carron Curie

## 2018-01-25 NOTE — Anesthesia Preprocedure Evaluation (Signed)
Anesthesia Evaluation  Patient identified by MRN, date of birth, ID band Patient awake    Reviewed: Allergy & Precautions, H&P , NPO status , Patient's Chart, lab work & pertinent test results, reviewed documented beta blocker date and time   Airway Mallampati: II  TM Distance: >3 FB Neck ROM: full    Dental no notable dental hx. (+) Teeth Intact   Pulmonary neg pulmonary ROS, former smoker,    Pulmonary exam normal breath sounds clear to auscultation       Cardiovascular Exercise Tolerance: Poor hypertension, On Medications + CAD, + Past MI, + Peripheral Vascular Disease and +CHF  negative cardio ROS  + dysrhythmias + pacemaker + Valvular Problems/Murmurs  Rhythm:regular Rate:Normal     Neuro/Psych  Headaches, Seizures -,  PSYCHIATRIC DISORDERS  Neuromuscular disease CVA negative neurological ROS  negative psych ROS   GI/Hepatic negative GI ROS, Neg liver ROS, GERD  ,  Endo/Other  negative endocrine ROSdiabetes, Well Controlled, Type 2, Oral Hypoglycemic Agents  Renal/GU      Musculoskeletal   Abdominal   Peds  Hematology negative hematology ROS (+)   Anesthesia Other Findings   Reproductive/Obstetrics negative OB ROS                             Anesthesia Physical Anesthesia Plan  ASA: IV  Anesthesia Plan: MAC   Post-op Pain Management:    Induction:   PONV Risk Score and Plan:   Airway Management Planned:   Additional Equipment:   Intra-op Plan:   Post-operative Plan:   Informed Consent: I have reviewed the patients History and Physical, chart, labs and discussed the procedure including the risks, benefits and alternatives for the proposed anesthesia with the patient or authorized representative who has indicated his/her understanding and acceptance.     Plan Discussed with: CRNA  Anesthesia Plan Comments:         Anesthesia Quick Evaluation

## 2018-01-25 NOTE — Op Note (Signed)
PREOPERATIVE DIAGNOSIS:  Nuclear sclerotic cataract of the right eye.   POSTOPERATIVE DIAGNOSIS:  NUCLEAR SCLEROTIC CATARACT RIGHT EYE   OPERATIVE PROCEDURE: Procedure(s): CATARACT EXTRACTION PHACO AND INTRAOCULAR LENS PLACEMENT ([facility])   SURGEON:  Galen Manila, MD.   ANESTHESIA:  Anesthesiologist: Yevette Edwards, MD CRNA: Rosario Jacks, CRNA  1.      Managed anesthesia care. 2.      0.38ml of Shugarcaine was instilled in the eye following the paracentesis.   COMPLICATIONS:  None.   TECHNIQUE:   Stop and chop   DESCRIPTION OF PROCEDURE:  The patient was examined and consented in the preoperative holding area where the aforementioned topical anesthesia was applied to the right eye and then brought back to the Operating Room where the right eye was prepped and draped in the usual sterile ophthalmic fashion and a lid speculum was placed. A paracentesis was created with the side port blade and the anterior chamber was filled with viscoelastic. A near clear corneal incision was performed with the steel keratome. A continuous curvilinear capsulorrhexis was performed with a cystotome followed by the capsulorrhexis forceps. Hydrodissection and hydrodelineation were carried out with BSS on a blunt cannula. The lens was removed in a stop and chop  technique and the remaining cortical material was removed with the irrigation-aspiration handpiece. The capsular bag was inflated with viscoelastic and the Technis [id number]  lens was placed in the capsular bag without complication. The remaining viscoelastic was removed from the eye with the irrigation-aspiration handpiece. The wounds were hydrated. The anterior chamber was flushed with Miostat and the eye was inflated to physiologic pressure. 0.15ml of Vigamox was placed in the anterior chamber. The wounds were found to be water tight. The eye was dressed with Vigamox. The patient was given protective glasses to wear throughout the day and a shield with which  to sleep tonight. The patient was also given drops with which to begin a drop regimen today and will follow-up with me in one day. Implant Name Type Inv. Item Serial No. Manufacturer Lot No. LRB No. Used  LENS IOL DIOP 20.0 - [id number] Intraocular Lens LENS IOL DIOP 20.0 [id number] AMO  Right 1   Procedure(s) with comments: CATARACT EXTRACTION PHACO AND INTRAOCULAR LENS PLACEMENT ([facility]) (Right) - [place] 00:27.0 AP% 14.1 CDE 3.81 Fluid Pack lot # [id number]  Electronically signed: Galen Manila 01/25/2018 10:07 AM

## 2018-02-15 DIAGNOSIS — Z7901 Long term (current) use of anticoagulants: Secondary | ICD-10-CM | POA: Insufficient documentation

## 2018-08-12 ENCOUNTER — Emergency Department
Admission: EM | Admit: 2018-08-12 | Discharge: 2018-08-12 | Disposition: A | Discharge status: Home / Self Care | Payer: Medicare Other | Attending: Student in an Organized Health Care Education/Training Program | Admitting: Student in an Organized Health Care Education/Training Program

## 2018-08-12 ENCOUNTER — Other Ambulatory Visit: Payer: Self-pay

## 2018-08-12 DIAGNOSIS — Z950 Presence of cardiac pacemaker: Secondary | ICD-10-CM | POA: Diagnosis not present

## 2018-08-12 DIAGNOSIS — Z7901 Long term (current) use of anticoagulants: Secondary | ICD-10-CM | POA: Insufficient documentation

## 2018-08-12 DIAGNOSIS — W260XXA Contact with knife, initial encounter: Secondary | ICD-10-CM | POA: Insufficient documentation

## 2018-08-12 DIAGNOSIS — Z79899 Other long term (current) drug therapy: Secondary | ICD-10-CM | POA: Insufficient documentation

## 2018-08-12 DIAGNOSIS — I2510 Atherosclerotic heart disease of native coronary artery without angina pectoris: Secondary | ICD-10-CM | POA: Insufficient documentation

## 2018-08-12 DIAGNOSIS — I110 Hypertensive heart disease with heart failure: Secondary | ICD-10-CM | POA: Insufficient documentation

## 2018-08-12 DIAGNOSIS — I509 Heart failure, unspecified: Secondary | ICD-10-CM | POA: Diagnosis not present

## 2018-08-12 DIAGNOSIS — Y9389 Activity, other specified: Secondary | ICD-10-CM | POA: Diagnosis not present

## 2018-08-12 DIAGNOSIS — Y999 Unspecified external cause status: Secondary | ICD-10-CM | POA: Insufficient documentation

## 2018-08-12 DIAGNOSIS — Y92129 Unspecified place in nursing home as the place of occurrence of the external cause: Secondary | ICD-10-CM | POA: Diagnosis not present

## 2018-08-12 DIAGNOSIS — S61212A Laceration without foreign body of right middle finger without damage to nail, initial encounter: Secondary | ICD-10-CM | POA: Insufficient documentation

## 2018-08-12 DIAGNOSIS — Z87891 Personal history of nicotine dependence: Secondary | ICD-10-CM | POA: Insufficient documentation

## 2018-08-12 DIAGNOSIS — F0390 Unspecified dementia without behavioral disturbance: Secondary | ICD-10-CM | POA: Insufficient documentation

## 2018-08-12 DIAGNOSIS — Z7982 Long term (current) use of aspirin: Secondary | ICD-10-CM | POA: Diagnosis not present

## 2018-08-12 DIAGNOSIS — S6991XA Unspecified injury of right wrist, hand and finger(s), initial encounter: Secondary | ICD-10-CM | POA: Diagnosis present

## 2018-08-12 NOTE — ED Triage Notes (Signed)
[facility] from [facility] with minor laceration noted to right long finger from razor.

## 2018-08-12 NOTE — ED Provider Notes (Signed)
[facility] Emergency Department Provider Note  ____________________________________________  Time seen: Approximately 9:33 PM  I have reviewed the triage vital signs and the nursing notes.   HISTORY  Chief Complaint Laceration    HPI John Rodgers is a 72 y.o. male patient presents emergency department via EMS for laceration to the third digit of the right hand.  Patient lives at a long-term care facility, accidentally cut his finger on a razor blade this evening.  Patient was reaching into a bag that held his razor, accidentally lacerated the tip of his finger.  Patient is on a blood thinner, and it bled freely initially.  Nurse at the facility was insistent that patient be "checked out" at the hospital.  Bleeding had ceased prior to arrival in the emergency department.  Up-to-date on all immunizations including tetanus.  No other complaints or injuries.    Past Medical History:  Diagnosis Date  . Arthritis   . CHF (congestive heart failure) (HCC)   . Coronary artery disease   . Dementia    VASCULAR  . Dementia   . Diarrhea    SEVERAL DAYS/ GIVEN MED TO STOP AT HOME HE LIVES IN  . Dysrhythmia    AFIB  . GERD (gastroesophageal reflux disease)   . Headache    MIGRAINE  . Heart murmur    SURGICAL REPAIR  . HOH (hard of hearing)   . Hypercholesterolemia   . Hypertension   . Myocardial infarction (HCC)   . Neuromuscular disorder (HCC)    MUSCLE WEAKNESS  . Osteoporosis   . Peripheral vascular disease (HCC)   . Presence of permanent cardiac pacemaker   . Seizures (HCC)    x1 years ago no longer on anti seizure medicines  . Stroke [facility]) 1996   more weakness left side CEREBELLAR STROKE SYNDROME  . Tunnel vision   . Vision loss    bil tunnel vision    Patient Active Problem List   Diagnosis Date Noted  . Carotid stenosis 01/17/2018  . Atherosclerosis of native arteries of extremity with intermittent claudication (HCC) 07/12/2017  . Other  mechanical complication of femoral arterial graft (bypass), sequela 05/07/2017  . Leg pain 04/29/2017  . DJD (degenerative joint disease), lumbosacral 04/29/2017  . Peripheral artery disease (HCC) 03/23/2017  . Essential hypertension 03/23/2017  . Hyperlipidemia 03/23/2017    Past Surgical History:  Procedure Laterality Date  . CARDIAC VALVE SURGERY    . CATARACT EXTRACTION W/PHACO Left 12/28/2017   Procedure: CATARACT EXTRACTION PHACO AND INTRAOCULAR LENS PLACEMENT (IOC);  Surgeon: Galen Manila, MD;  Location: [facility] ORS;  Service: Ophthalmology;  Laterality: Left;  [place] 00:17.6 AP% 12.7 CDE 2.24 Fluid Pack LOt # [id number]  . CATARACT EXTRACTION W/PHACO Right 01/25/2018   Procedure: CATARACT EXTRACTION PHACO AND INTRAOCULAR LENS PLACEMENT (IOC);  Surgeon: Galen Manila, MD;  Location: [facility] ORS;  Service: Ophthalmology;  Laterality: Right;  [place] 00:27.0 AP% 14.1 CDE 3.81 Fluid Pack lot # [id number]  . HERNIA REPAIR    . INSERT / REPLACE / REMOVE PACEMAKER    . LOWER EXTREMITY ANGIOGRAPHY Left 05/11/2017   Procedure: Lower Extremity Angiography;  Surgeon: Renford Dills, MD;  Location: [facility] INVASIVE CV LAB;  Service: Cardiovascular;  Laterality: Left;  . LOWER EXTREMITY INTERVENTION  05/11/2017   Procedure: Lower Extremity Intervention;  Surgeon: Renford Dills, MD;  Location: [facility] INVASIVE CV LAB;  Service: Cardiovascular;;  . MURMUR     HEART REPAIR  . PERIPHERAL VASCULAR CATHETERIZATION Left 02/04/2016  Procedure: Lower Extremity Angiography;  Surgeon: Renford Dills, MD;  Location: [facility] INVASIVE CV LAB;  Service: Cardiovascular;  Laterality: Left;  . PERIPHERAL VASCULAR CATHETERIZATION  02/04/2016   Procedure: Lower Extremity Intervention;  Surgeon: Renford Dills, MD;  Location: [facility] INVASIVE CV LAB;  Service: Cardiovascular;;  . VASCULAR SURGERY      Prior to Admission medications   Medication Sig Start Date End Date Taking? Authorizing Provider  aspirin EC 81 MG  tablet Take 1 tablet (81 mg total) by mouth daily. 02/05/16   Schnier, Latina Craver, MD  calcium gluconate 500 MG tablet Take 500 mg by mouth 2 (two) times daily.    [provider]  donepezil (ARICEPT) 5 MG tablet Take 5 mg by mouth daily.     [provider]  DUREZOL 0.05 % EMUL Place 1 drop into the left eye 2 (two) times daily.  12/20/17   [provider]  furosemide (LASIX) 20 MG tablet Take 20 mg by mouth daily.  12/24/16   [provider]  HYDROcodone-acetaminophen (NORCO/VICODIN) 5-325 MG tablet Take 1 tablet by mouth every 6 (six) hours as needed for moderate pain.     [provider]  ILEVRO 0.3 % ophthalmic suspension Place 1 drop into the left eye at bedtime.  12/20/17   [provider]  loperamide (IMODIUM A-D) 2 MG tablet Take 4 mg by mouth See admin instructions. Give 2 tablets after first loose stool and 1 tablet after each consecutive stool    [provider]  memantine (NAMENDA) 5 MG tablet Take 5 mg by mouth 2 (two) times daily.    [provider]  metoprolol tartrate (LOPRESSOR) 25 MG tablet Take 25 mg by mouth 2 (two) times daily. 0800 & 2100 04/01/17   [provider]  moxifloxacin (VIGAMOX) 0.5 % ophthalmic solution Place 1 drop into the left eye 4 (four) times daily.    [provider]  ramipril (ALTACE) 1.25 MG capsule Take 1.25 mg by mouth daily.    [provider]  simvastatin (ZOCOR) 10 MG tablet Take 10 mg by mouth at bedtime.     [provider]  tamsulosin (FLOMAX) 0.4 MG CAPS capsule Take 0.4 mg by mouth daily. 05/04/17   [provider]  Vitamin D, Ergocalciferol, (DRISDOL) 50000 units CAPS capsule Take 50,000 Units by mouth every 30 (thirty) days. On the 5th of the month    [provider]  warfarin (COUMADIN) 5 MG tablet Take 5 mg by mouth at bedtime.  08/20/08   [provider]    Allergies Patient has no known allergies.  No family  history on file.  Social History Social History   Tobacco Use  . Smoking status: Former Games developer  . Smokeless tobacco: Never Used  Substance Use Topics  . Alcohol use: No  . Drug use: No     Review of Systems  Constitutional: No fever/chills Eyes: No visual changes. No discharge ENT: No upper respiratory complaints. Cardiovascular: no chest pain. Respiratory: no cough. No SOB. Gastrointestinal: No abdominal pain.  No nausea, no vomiting.   Musculoskeletal: Negative for musculoskeletal pain. Skin: Positive for laceration to the third digit of the right hand Neurological: Negative for headaches, focal weakness or numbness. 10-point ROS otherwise negative.  ____________________________________________   PHYSICAL EXAM:  VITAL SIGNS: ED Triage Vitals  Enc Vitals Group     BP 08/12/18 2125 (!) 167/81     Pulse Rate 08/12/18 2125 83  Resp 08/12/18 2125 16     Temp 08/12/18 2125 98 F (36.7 C)     Temp Source 08/12/18 2125 Oral     SpO2 08/12/18 2125 96 %     Weight 08/12/18 2126 182 lb (82.6 kg)     Height 08/12/18 2126 5\' 10"  (1.778 m)     Head Circumference --      Peak Flow --      Pain Score 08/12/18 2125 0     Pain Loc --      Pain Edu? --      Excl. in GC? --      Constitutional: Alert and oriented. Well appearing and in no acute distress. Eyes: Conjunctivae are normal. PERRL. EOMI. Head: Atraumatic. Neck: No stridor.    Cardiovascular: Normal rate, regular rhythm. Normal S1 and S2.  Good peripheral circulation. Respiratory: Normal respiratory effort without tachypnea or retractions. Lungs CTAB. Good air entry to the bases with no decreased or absent breath sounds. Musculoskeletal: Full range of motion to all extremities. No gross deformities appreciated. Neurologic:  Normal speech and language. No gross focal neurologic deficits are appreciated.  Skin:  Skin is warm, dry and intact. No rash noted.  Extremity superficial 0.5 cm laceration noted to the  distal aspect of the third digit right hand.  No active bleeding.  No foreign body.  Full range of motion to the digit.  Sensation and cap refill intact to the digit. Psychiatric: Mood and affect are normal. Speech and behavior are normal. Patient exhibits appropriate insight and judgement.   ____________________________________________   LABS (all labs ordered are listed, but only abnormal results are displayed)  Labs Reviewed - No data to display ____________________________________________  EKG   ____________________________________________  RADIOLOGY   No results found.  ____________________________________________    PROCEDURES  Procedure(s) performed:    Marland KitchenMarland KitchenLaceration Repair Date/Time: 08/12/2018 9:33 PM Performed by: Racheal Patches, PA-C Authorized by: Racheal Patches, PA-C   Consent:    Consent obtained:  Verbal   Consent given by:  Patient   Risks discussed:  Pain   Alternatives discussed:  No treatment Anesthesia (see MAR for exact dosages):    Anesthesia method:  None Laceration details:    Location:  Finger   Finger location:  R long finger   Length (cm):  0.5 Exploration:    Hemostasis achieved with:  Direct pressure   Wound extent: no foreign bodies/material noted, no muscle damage noted, no nerve damage noted, no tendon damage noted, no underlying fracture noted and no vascular damage noted     Contaminated: no   Treatment:    Area cleansed with:  Shur-Clens   Amount of cleaning:  Standard Skin repair:    Repair method:  Tissue adhesive Approximation:    Approximation:  Close Post-procedure details:    Dressing:  Open (no dressing)   Patient tolerance of procedure:  Tolerated well, no immediate complications      Medications - No data to display   ____________________________________________   INITIAL IMPRESSION / ASSESSMENT AND PLAN / ED COURSE  Pertinent labs & imaging results that were available during my care of  the patient were reviewed by me and considered in my medical decision making (see chart for details).  Review of the [place] CSRS was performed in accordance of the [facility] prior to dispensing any controlled drugs.      Patient's diagnosis is consistent with laceration to the third digit of the right hand.  Patient sustained a  superficial laceration that bled freely after cutting his finger with a razor blade.  Laceration was very superficial.  Area was thoroughly cleansed, covered using Dermabond as described above.  Patient tolerated well with no complications.  No prescriptions at this time.  Patient may follow-up with primary care as needed.  Patient will be discharged into care of EMS will transport patient back to his home.  Patient is given ED precautions to return to the ED for any worsening or new symptoms.     ____________________________________________  FINAL CLINICAL IMPRESSION(S) / ED DIAGNOSES  Final diagnoses:  Laceration of right middle finger without foreign body without damage to nail, initial encounter      NEW MEDICATIONS STARTED DURING THIS VISIT:  ED Discharge Orders    None          This chart was dictated using voice recognition software/Dragon. Despite best efforts to proofread, errors can occur which can change the meaning. Any change was purely unintentional.    Lanette HampshireCuthriell, Ziah Turvey D, PA-C 08/12/18 2137    Willy Eddyobinson, Patrick, MD 08/12/18 2242

## 2018-12-07 ENCOUNTER — Other Ambulatory Visit (HOSPITAL_COMMUNITY): Payer: Self-pay | Admitting: Family Medicine

## 2018-12-07 ENCOUNTER — Other Ambulatory Visit: Payer: Self-pay

## 2018-12-07 ENCOUNTER — Other Ambulatory Visit: Payer: Self-pay | Admitting: Family Medicine

## 2018-12-07 ENCOUNTER — Ambulatory Visit
Admission: RE | Admit: 2018-12-07 | Discharge: 2018-12-07 | Disposition: A | Discharge status: Home / Self Care | Payer: Medicare Other | Source: Ambulatory Visit | Attending: Family Medicine | Admitting: Family Medicine

## 2018-12-07 DIAGNOSIS — K37 Unspecified appendicitis: Secondary | ICD-10-CM | POA: Diagnosis present

## 2018-12-07 MED ORDER — IOHEXOL 300 MG/ML  SOLN
100.0000 mL | Freq: Once | INTRAMUSCULAR | Status: AC | PRN
  Administered 2018-12-07: 100 mL via INTRAVENOUS

## 2019-01-23 ENCOUNTER — Ambulatory Visit (INDEPENDENT_AMBULATORY_CARE_PROVIDER_SITE_OTHER): Payer: Medicare Other | Admitting: Vascular Surgery

## 2019-01-23 ENCOUNTER — Encounter (INDEPENDENT_AMBULATORY_CARE_PROVIDER_SITE_OTHER): Payer: Self-pay | Admitting: Vascular Surgery

## 2019-01-23 ENCOUNTER — Ambulatory Visit (INDEPENDENT_AMBULATORY_CARE_PROVIDER_SITE_OTHER): Payer: Medicare Other

## 2019-01-23 ENCOUNTER — Other Ambulatory Visit: Payer: Self-pay

## 2019-01-23 VITALS — BP 153/81 | HR 69 | Resp 16

## 2019-01-23 DIAGNOSIS — Z79899 Other long term (current) drug therapy: Secondary | ICD-10-CM

## 2019-01-23 DIAGNOSIS — Z95820 Peripheral vascular angioplasty status with implants and grafts: Secondary | ICD-10-CM | POA: Diagnosis not present

## 2019-01-23 DIAGNOSIS — I739 Peripheral vascular disease, unspecified: Secondary | ICD-10-CM

## 2019-01-23 DIAGNOSIS — I6523 Occlusion and stenosis of bilateral carotid arteries: Secondary | ICD-10-CM | POA: Diagnosis not present

## 2019-01-23 DIAGNOSIS — E785 Hyperlipidemia, unspecified: Secondary | ICD-10-CM

## 2019-01-23 DIAGNOSIS — M47817 Spondylosis without myelopathy or radiculopathy, lumbosacral region: Secondary | ICD-10-CM

## 2019-01-23 DIAGNOSIS — Z9862 Peripheral vascular angioplasty status: Secondary | ICD-10-CM

## 2019-01-23 DIAGNOSIS — I6521 Occlusion and stenosis of right carotid artery: Secondary | ICD-10-CM

## 2019-01-23 DIAGNOSIS — Z7982 Long term (current) use of aspirin: Secondary | ICD-10-CM

## 2019-01-23 DIAGNOSIS — Z87891 Personal history of nicotine dependence: Secondary | ICD-10-CM

## 2019-01-23 DIAGNOSIS — Z95828 Presence of other vascular implants and grafts: Secondary | ICD-10-CM

## 2019-01-23 DIAGNOSIS — Z791 Long term (current) use of non-steroidal anti-inflammatories (NSAID): Secondary | ICD-10-CM

## 2019-01-23 DIAGNOSIS — Z7902 Long term (current) use of antithrombotics/antiplatelets: Secondary | ICD-10-CM

## 2019-01-23 DIAGNOSIS — I10 Essential (primary) hypertension: Secondary | ICD-10-CM | POA: Diagnosis not present

## 2019-01-23 NOTE — Progress Notes (Signed)
MRN : [id number]  John BodilyJames Rodgers is a 73 y.o. (26-Aug-1946) male who presents with chief complaint of  Chief Complaint  Patient presents with   Follow-up    7976yr ultrasound follow up  .  History of Present Illness:   The patient returns to the office for followup and review status post angiogram with interventionon 05/11/2017. He was last seen in the office on 12/2017.  The patient is seen for follow up evaluation of carotid stenosis. The carotid stenosis followed by ultrasound.   The patient describes visual changes that are similar to amaurosis fugax. There is no recent history of TIA symptoms or focal motor deficits. There is no prior documented CVA.  The patient is taking enteric-coated aspirin 81 mg daily.  There is no history of migraine headaches. There is no history of seizures.  Procedure(s) Performed 05/11/2017: 1. Introduction catheter into leftlower extremity 3rd order catheter placement  2.Contrast injection leftlower extremity for distal runoff with additional 3rd order  3. Percutaneous transluminal angioplasty left anterior tibial bypass to 4 mm using a Lutonix drug-eluting balloon 4. Percutaneous transluminal angioplasty left external iliacartery to 7 mm with a Lutonix drug-eluting balloon  5. Percutaneous transluminal angioplasty right external iliac artery with a 7 mm Lutonix drug-eluting balloon 6.Star close closure rightcommon femoral arteriotomy  The patient notes improvement in the lower extremity symptoms. No interval shortening of the patient's claudication distance or rest pain symptoms. No new ulcers or wounds have occurred since the last visit.  There have been no significant changes to the patient's overall health care.  The patient denies history of DVT, PE or superficial thrombophlebitis. The patient denies recent episodes of angina or shortness of breath.    Duplex ultrasound of the carotid arteries shows >80% RICA and <50% LICA  ABI's Rt=1.13and Lt=1.42AT signals are triphasic on the left and biphasic on the right (previous ABI's Rt=1.02and Lt=1.22) Duplex US of theaorta and iliac arteries shows the stents are widely patent, aortic diameter is stable at 2.5 cm   Current Meds  Medication Sig   aspirin EC 81 MG tablet Take 1 tablet (81 mg total) by mouth daily.   calcium gluconate 500 MG tablet Take 500 mg by mouth 2 (two) times daily.   donepezil (ARICEPT) 5 MG tablet Take 5 mg by mouth daily.    DUREZOL 0.05 % EMUL Place 1 drop into the left eye 2 (two) times daily.    furosemide (LASIX) 20 MG tablet Take 20 mg by mouth daily.    HYDROcodone-acetaminophen (NORCO/VICODIN) 5-325 MG tablet Take 1 tablet by mouth every 6 (six) hours as needed for moderate pain.    ILEVRO 0.3 % ophthalmic suspension Place 1 drop into the left eye at bedtime.    loperamide (IMODIUM A-D) 2 MG tablet Take 4 mg by mouth See admin instructions. Give 2 tablets after first loose stool and 1 tablet after each consecutive stool   memantine (NAMENDA) 5 MG tablet Take 5 mg by mouth 2 (two) times daily.   metoprolol tartrate (LOPRESSOR) 25 MG tablet Take 25 mg by mouth 2 (two) times daily. 0800 & 2100   ramipril (ALTACE) 1.25 MG capsule Take 1.25 mg by mouth daily.   simvastatin (ZOCOR) 10 MG tablet Take 10 mg by mouth at bedtime.    tamsulosin (FLOMAX) 0.4 MG CAPS capsule Take 0.4 mg by mouth daily.   Vitamin D, Ergocalciferol, (DRISDOL) 50000 units CAPS capsule Take 50,000 Units by mouth every 30 (thirty) days. On the 5th  of the month   warfarin (COUMADIN) 5 MG tablet Take 5 mg by mouth at bedtime.     Past Medical History:  Diagnosis Date   Arthritis    CHF (congestive heart failure) (HCC)    Coronary artery disease    Dementia (HCC)    VASCULAR   Dementia (HCC)    Diarrhea    SEVERAL DAYS/ GIVEN MED TO STOP AT HOME HE LIVES IN    Dysrhythmia    AFIB   GERD (gastroesophageal reflux disease)    Headache    MIGRAINE   Heart murmur    SURGICAL REPAIR   HOH (hard of hearing)    Hypercholesterolemia    Hypertension    Myocardial infarction (HCC)    Neuromuscular disorder (HCC)    MUSCLE WEAKNESS   Osteoporosis    Peripheral vascular disease (HCC)    Presence of permanent cardiac pacemaker    Seizures (HCC)    x1 years ago no longer on anti seizure medicines   Stroke (HCC) 1996   more weakness left side CEREBELLAR STROKE SYNDROME   Tunnel vision    Vision loss    bil tunnel vision    Past Surgical History:  Procedure Laterality Date   CARDIAC VALVE SURGERY     CATARACT EXTRACTION W/PHACO Left 12/28/2017   Procedure: CATARACT EXTRACTION PHACO AND INTRAOCULAR LENS PLACEMENT (IOC);  Surgeon: Galen Manila, MD;  Location: [facility] ORS;  Service: Ophthalmology;  Laterality: Left;  [place] 00:17.6 AP% 12.7 CDE 2.24 Fluid Pack LOt # [id number]   CATARACT EXTRACTION W/PHACO Right 01/25/2018   Procedure: CATARACT EXTRACTION PHACO AND INTRAOCULAR LENS PLACEMENT (IOC);  Surgeon: Galen Manila, MD;  Location: [facility] ORS;  Service: Ophthalmology;  Laterality: Right;  [place] 00:27.0 AP% 14.1 CDE 3.81 Fluid Pack lot # [id number]   HERNIA REPAIR     INSERT / REPLACE / REMOVE PACEMAKER     LOWER EXTREMITY ANGIOGRAPHY Left 05/11/2017   Procedure: Lower Extremity Angiography;  Surgeon: Renford Dills, MD;  Location: [facility] INVASIVE CV LAB;  Service: Cardiovascular;  Laterality: Left;   LOWER EXTREMITY INTERVENTION  05/11/2017   Procedure: Lower Extremity Intervention;  Surgeon: Renford Dills, MD;  Location: [facility] INVASIVE CV LAB;  Service: Cardiovascular;;   MURMUR     HEART REPAIR   PERIPHERAL VASCULAR CATHETERIZATION Left 02/04/2016   Procedure: Lower Extremity Angiography;  Surgeon: Renford Dills, MD;  Location: [facility] INVASIVE CV LAB;  Service: Cardiovascular;  Laterality: Left;   PERIPHERAL VASCULAR  CATHETERIZATION  02/04/2016   Procedure: Lower Extremity Intervention;  Surgeon: Renford Dills, MD;  Location: [facility] INVASIVE CV LAB;  Service: Cardiovascular;;   VASCULAR SURGERY      Social History Social History   Tobacco Use   Smoking status: Former Smoker   Smokeless tobacco: Never Used  Substance Use Topics   Alcohol use: No   Drug use: No    Family History No family history on file.  No Known Allergies   REVIEW OF SYSTEMS (Negative unless checked)  Constitutional: Weight loss  Fever  Chills Cardiac: Chest pain   Chest pressure   Palpitations   Shortness of breath when laying flat   Shortness of breath with exertion. Vascular:  Pain in legs with walking   Pain in legs at rest  History of DVT   Phlebitis   Swelling in legs   Varicose veins   Non-healing ulcers Pulmonary:   Uses home oxygen   Productive cough   Hemoptysis   Wheeze    COPD   Asthma Neurologic:  Dizziness   Seizures   History of stroke   History of TIA  Aphasia   Vissual changes   Weakness or numbness in arm   Weakness or numbness in leg Musculoskeletal:   Joint swelling   Joint pain   Low back pain Hematologic:  Easy bruising  Easy bleeding   Hypercoagulable state   Anemic Gastrointestinal:  Diarrhea   Vomiting  Gastroesophageal reflux/heartburn   Difficulty swallowing. Genitourinary:  Chronic kidney disease   Difficult urination  Frequent urination   Blood in urine Skin:  Rashes   Ulcers  Psychological:  History of anxiety    History of major depression.  Physical Examination  Vitals:   01/23/19 1021  BP: (!) 153/81  Pulse: 69  Resp: 16   There is no height or weight on file to calculate BMI. Gen: WD/WN, NAD, seen in a wheelchair Head: [place]/AT, No temporalis wasting.  Ear/Nose/Throat: bilateral hearing aides, nares w/o erythema or drainage Eyes: PER, EOMI, sclera nonicteric.  Neck: Supple, no large  masses.   Pulmonary:  Good air movement, no audible wheezing bilaterally, no use of accessory muscles.  Cardiac: RRR, no JVD Vascular: right carotid bruit Vessel Right Left  Radial Palpable Palpable  Carotid Palpable Palpable  PT Palpable Palpable  DP Palpable Palpable  Gastrointestinal: Non-distended. No guarding/no peritoneal signs.  Musculoskeletal: M/S 5/5 throughout.  No deformity or atrophy.  Neurologic: CN 2-12 intact. Symmetrical.  Speech is fluent. Motor exam as listed above. Psychiatric: Judgment intact, Mood & affect appropriate for pt's clinical situation. Dermatologic: No rashes or ulcers noted.  No changes consistent with cellulitis. Lymph : No lichenification or skin changes of chronic lymphedema.  CBC Lab Results  Component Value Date   WBC 5.9 05/10/2017   HGB 13.9 05/10/2017   HCT 40.3 05/10/2017   MCV 89.9 05/10/2017   PLT 148 (L) 05/10/2017    BMET    Component Value Date/Time   NA 137 03/30/2014 1100   K 4.2 03/30/2014 1100   CL 101 03/30/2014 1100   CO2 27 03/30/2014 1100   GLUCOSE 94 03/30/2014 1100   BUN 28 (H) 05/10/2017 1052   BUN 27 (H) 03/30/2014 1100   CREATININE 1.19 05/10/2017 1052   CREATININE 1.29 03/30/2014 1100   CALCIUM 8.9 03/30/2014 1100   GFRNONAA 60 (L) 05/10/2017 1052   GFRNONAA 57 (L) 03/30/2014 1100   GFRAA >60 05/10/2017 1052   GFRAA >60 03/30/2014 1100   CrCl cannot be calculated (Patient's most recent lab result is older than the maximum 21 days allowed.).  COAG Lab Results  Component Value Date   INR 2.04 02/04/2016   INR 1.9 03/30/2014   INR 2.0 01/24/2013    Radiology No results found.    Assessment/Plan 1. Stenosis of right carotid artery The patient remains asymptomatic with respect to the carotid stenosis.  However, the patient has now progressed and has a lesion the is >70%.  Patient should undergo CT angiography of the carotid arteries to define the degree of stenosis of the internal carotid arteries  bilaterally and the anatomic suitability for surgery vs. intervention.  If the patient does indeed need surgery cardiac clearance will be required, once cleared the patient will be scheduled for surgery.  The risks, benefits and alternative therapies were reviewed in detail with the patient.  All questions were answered.  The patient agrees to proceed with imaging.  Continue antiplatelet therapy as prescribed. Continue management of CAD, HTN and Hyperlipidemia.  Healthy heart diet, encouraged exercise at least 4 times per week.    2. Peripheral artery disease (HCC)  Recommend:  The patient has evidence of atherosclerosis of the lower extremities with claudication.  The patient does not voice lifestyle limiting changes at this point in time.  Noninvasive studies do not suggest clinically significant change.  No invasive studies, angiography or surgery at this time The patient should continue walking and begin a more formal exercise program.  The patient should continue antiplatelet therapy and aggressive treatment of the lipid abnormalities  No changes in the patient's medications at this time  The patient should continue wearing graduated compression socks 10-15 mmHg strength to control the mild edema.    3. Essential hypertension Continue antihypertensive medications as already ordered, these medications have been reviewed and there are no changes at this time.   4. DJD (degenerative joint disease), lumbosacral Continue NSAID medications as already ordered, these medications have been reviewed and there are no changes at this time.  Continued activity and therapy was stressed.   5. Hyperlipidemia, unspecified hyperlipidemia type Continue statin as ordered and reviewed, no changes at this time     Levora Dredge, MD  01/23/2019 10:30 AM

## 2019-01-25 ENCOUNTER — Ambulatory Visit
Admission: RE | Admit: 2019-01-25 | Discharge: 2019-01-25 | Disposition: A | Discharge status: Home / Self Care | Payer: Medicare Other | Source: Ambulatory Visit | Attending: Vascular Surgery | Admitting: Vascular Surgery

## 2019-01-25 ENCOUNTER — Other Ambulatory Visit: Payer: Self-pay

## 2019-01-25 DIAGNOSIS — I6521 Occlusion and stenosis of right carotid artery: Secondary | ICD-10-CM | POA: Diagnosis present

## 2019-01-25 LAB — POCT I-STAT CREATININE: Creatinine, Ser: 1 mg/dL (ref 0.61–1.24)

## 2019-01-25 MED ORDER — IOHEXOL 350 MG/ML SOLN
75.0000 mL | Freq: Once | INTRAVENOUS | Status: AC | PRN
  Administered 2019-01-25: 15:00:00 75 mL via INTRAVENOUS

## 2019-05-11 DIAGNOSIS — R0602 Shortness of breath: Secondary | ICD-10-CM | POA: Insufficient documentation

## 2019-07-05 ENCOUNTER — Telehealth (INDEPENDENT_AMBULATORY_CARE_PROVIDER_SITE_OTHER): Payer: Self-pay | Admitting: Vascular Surgery

## 2019-07-05 ENCOUNTER — Other Ambulatory Visit (INDEPENDENT_AMBULATORY_CARE_PROVIDER_SITE_OTHER): Payer: Self-pay | Admitting: Nurse Practitioner

## 2019-07-05 DIAGNOSIS — I739 Peripheral vascular disease, unspecified: Secondary | ICD-10-CM

## 2019-07-05 DIAGNOSIS — M79605 Pain in left leg: Secondary | ICD-10-CM

## 2019-07-05 NOTE — Telephone Encounter (Signed)
Bring him in with ABIs, me or schnier

## 2019-07-06 ENCOUNTER — Ambulatory Visit (INDEPENDENT_AMBULATORY_CARE_PROVIDER_SITE_OTHER): Payer: Medicare Other

## 2019-07-06 ENCOUNTER — Other Ambulatory Visit (INDEPENDENT_AMBULATORY_CARE_PROVIDER_SITE_OTHER): Payer: Self-pay | Admitting: Nurse Practitioner

## 2019-07-06 ENCOUNTER — Other Ambulatory Visit: Payer: Self-pay

## 2019-07-06 ENCOUNTER — Ambulatory Visit (INDEPENDENT_AMBULATORY_CARE_PROVIDER_SITE_OTHER): Payer: Medicare Other | Admitting: Nurse Practitioner

## 2019-07-06 ENCOUNTER — Encounter (INDEPENDENT_AMBULATORY_CARE_PROVIDER_SITE_OTHER): Payer: Self-pay | Admitting: Nurse Practitioner

## 2019-07-06 VITALS — BP 146/73 | HR 86 | Resp 12 | Ht 70.0 in | Wt 181.4 lb

## 2019-07-06 DIAGNOSIS — M79605 Pain in left leg: Secondary | ICD-10-CM

## 2019-07-06 DIAGNOSIS — L03116 Cellulitis of left lower limb: Secondary | ICD-10-CM | POA: Diagnosis not present

## 2019-07-06 DIAGNOSIS — M7989 Other specified soft tissue disorders: Secondary | ICD-10-CM

## 2019-07-06 DIAGNOSIS — E785 Hyperlipidemia, unspecified: Secondary | ICD-10-CM

## 2019-07-06 DIAGNOSIS — I739 Peripheral vascular disease, unspecified: Secondary | ICD-10-CM | POA: Diagnosis not present

## 2019-07-06 DIAGNOSIS — I890 Lymphedema, not elsewhere classified: Secondary | ICD-10-CM | POA: Diagnosis not present

## 2019-07-06 DIAGNOSIS — I70213 Atherosclerosis of native arteries of extremities with intermittent claudication, bilateral legs: Secondary | ICD-10-CM

## 2019-07-06 MED ORDER — DOXYCYCLINE HYCLATE 100 MG PO CAPS: 100.0000 mg | ORAL_CAPSULE | Freq: Two times a day (BID) | ORAL | 0 refills | Status: DC

## 2019-07-11 ENCOUNTER — Encounter (INDEPENDENT_AMBULATORY_CARE_PROVIDER_SITE_OTHER): Payer: Self-pay | Admitting: Nurse Practitioner

## 2019-07-11 NOTE — Progress Notes (Signed)
SUBJECTIVE:  Patient ID: John Rodgers, male    DOB: [birth year], 73 y.o.   MRN: [id number] Chief Complaint  Patient presents with  . Follow-up    HPI  John Rodgers is a 73 y.o. male that presents today for evaluation of lower extremity edema.  The patient's nursing facility called to say that he has been having redness and swelling in his left lower extremity for the last 1-1/2 weeks.  The patient states that his leg is sore and painful.  The leg appears to be hot, erythematous and markedly swollen from the right lower extremity.  He denies any fever, chills, nausea, vomiting or diarrhea.  He denies any chest pain or shortness of breath.  Patient states that he regularly has TED hose on at his facility.  Patient has a previous history of peripheral arterial interventions with the history of a Pham peroneal bypass graft as well as an anterior tibial artery jump graft.  Today the patient has an ABI of 1.18 on the right and 1.09 on the left.  The patient's TBI 0.43 on the right and 0.98 on the left.  Previous ABIs done on 01/23/2019 reveal an ABI of 1.13 on the right and 1.31 on the left.  These are very likely unreliable.  The patient has monophasic waveforms in the bilateral tibial arteries.  The waveforms in the left lower extremity are strong.  The patient also has strong bilateral toe waveforms.  The patient also underwent a DVT study which revealed no evidence of DVT, superficial venous thrombosis or chronic venous insufficiency.  Past Medical History:  Diagnosis Date  . Arthritis   . CHF (congestive heart failure) (HCC)   . Coronary artery disease   . Dementia (HCC)    VASCULAR  . Dementia (HCC)   . Diarrhea    SEVERAL DAYS/ GIVEN MED TO STOP AT HOME HE LIVES IN  . Dysrhythmia    AFIB  . GERD (gastroesophageal reflux disease)   . Headache    MIGRAINE  . Heart murmur    SURGICAL REPAIR  . HOH (hard of hearing)   . Hypercholesterolemia   . Hypertension   . Myocardial infarction  (HCC)   . Neuromuscular disorder (HCC)    MUSCLE WEAKNESS  . Osteoporosis   . Peripheral vascular disease (HCC)   . Presence of permanent cardiac pacemaker   . Seizures (HCC)    x1 years ago no longer on anti seizure medicines  . Stroke [facility]) 1996   more weakness left side CEREBELLAR STROKE SYNDROME  . Tunnel vision   . Vision loss    bil tunnel vision    Past Surgical History:  Procedure Laterality Date  . CARDIAC VALVE SURGERY    . CATARACT EXTRACTION W/PHACO Left 12/28/2017   Procedure: CATARACT EXTRACTION PHACO AND INTRAOCULAR LENS PLACEMENT (IOC);  Surgeon: Galen Manila, MD;  Location: [facility] ORS;  Service: Ophthalmology;  Laterality: Left;  [place] 00:17.6 AP% 12.7 CDE 2.24 Fluid Pack LOt # [id number]  . CATARACT EXTRACTION W/PHACO Right 01/25/2018   Procedure: CATARACT EXTRACTION PHACO AND INTRAOCULAR LENS PLACEMENT (IOC);  Surgeon: Galen Manila, MD;  Location: [facility] ORS;  Service: Ophthalmology;  Laterality: Right;  [place] 00:27.0 AP% 14.1 CDE 3.81 Fluid Pack lot # [id number]  . HERNIA REPAIR    . INSERT / REPLACE / REMOVE PACEMAKER    . LOWER EXTREMITY ANGIOGRAPHY Left 05/11/2017   Procedure: Lower Extremity Angiography;  Surgeon: Renford Dills, MD;  Location: [facility] INVASIVE CV LAB;  Service:  Cardiovascular;  Laterality: Left;  . LOWER EXTREMITY INTERVENTION  05/11/2017   Procedure: Lower Extremity Intervention;  Surgeon: Renford Dills, MD;  Location: [facility] INVASIVE CV LAB;  Service: Cardiovascular;;  . MURMUR     HEART REPAIR  . PERIPHERAL VASCULAR CATHETERIZATION Left 02/04/2016   Procedure: Lower Extremity Angiography;  Surgeon: Renford Dills, MD;  Location: [facility] INVASIVE CV LAB;  Service: Cardiovascular;  Laterality: Left;  . PERIPHERAL VASCULAR CATHETERIZATION  02/04/2016   Procedure: Lower Extremity Intervention;  Surgeon: Renford Dills, MD;  Location: [facility] INVASIVE CV LAB;  Service: Cardiovascular;;  . VASCULAR SURGERY      Social History   Socioeconomic  History  . Marital status: Single    Spouse name: Not on file  . Number of children: Not on file  . Years of education: Not on file  . Highest education level: Not on file  Occupational History  . Not on file  Social Needs  . Financial resource strain: Not on file  . Food insecurity    Worry: Not on file    Inability: Not on file  . Transportation needs    Medical: Not on file    Non-medical: Not on file  Tobacco Use  . Smoking status: Former Games developer  . Smokeless tobacco: Never Used  Substance and Sexual Activity  . Alcohol use: No  . Drug use: No  . Sexual activity: Not on file  Lifestyle  . Physical activity    Days per week: Not on file    Minutes per session: Not on file  . Stress: Not on file  Relationships  . Social Musician on phone: Not on file    Gets together: Not on file    Attends religious service: Not on file    Active member of club or organization: Not on file    Attends meetings of clubs or organizations: Not on file    Relationship status: Not on file  . Intimate partner violence    Fear of current or ex partner: Not on file    Emotionally abused: Not on file    Physically abused: Not on file    Forced sexual activity: Not on file  Other Topics Concern  . Not on file  Social History Narrative  . Not on file    History reviewed. No pertinent family history.  No Known Allergies   Review of Systems   Review of Systems: Negative Unless Checked Constitutional: Weight loss  Fever  Chills Cardiac: Chest pain    Atrial Fibrillation  Palpitations   Shortness of breath when laying flat   Shortness of breath with exertion. Shortness of breath at rest Vascular:  Pain in legs with walking   Pain in legs with standing Pain in legs when laying flat   Claudication    Pain in feet when laying flat    History of DVT   Phlebitis   Swelling in legs   Varicose veins   Non-healing ulcers Pulmonary:   Uses home  oxygen   Productive cough   Hemoptysis   Wheeze  COPD   Asthma Neurologic:  Dizziness   Seizures  Blackouts History of stroke   History of TIA  Aphasia   Temporary Blindness   Weakness or numbness in arm   Weakness or numbness in leg Musculoskeletal:   Joint swelling   Joint pain   Low back pain   History of Knee Replacement Arthritis back Surgeries   Spinal  Stenosis    Hematologic:  [] Easy bruising  [] Easy bleeding   [] Hypercoagulable state   [] Anemic Gastrointestinal:  [] Diarrhea   [] Vomiting  [] Gastroesophageal reflux/heartburn   [] Difficulty swallowing. [] Abdominal pain Genitourinary:  [] Chronic kidney disease   [] Difficult urination  [] Anuric   [] Blood in urine [] Frequent urination  [] Burning with urination   [] Hematuria Skin:  [x] Rashes   [] Ulcers [x] Wounds Psychological:  [] History of anxiety   []  History of major depression  [x]  Memory Difficulties      OBJECTIVE:   Physical Exam  BP (!) 146/73 (BP Location: Left Arm, Patient Position: Sitting, Cuff Size: Normal)   Pulse 86   Resp 12   Ht 5\' 10"  (1.778 m)   Wt 181 lb 6.4 oz (82.3 kg)   BMI 26.03 kg/m   Gen: WD/WN, NAD Head: [place]/AT, No temporalis wasting.  Ear/Nose/Throat: Hearing grossly intact, nares w/o erythema or drainage Eyes: PER, EOMI, sclera nonicteric.  Neck: Supple, no masses.  No JVD.  Pulmonary:  Good air movement, no use of accessory muscles.  Cardiac: RRR Vascular:  3+ left lower extremity edema Vessel Right Left  Radial Palpable Palpable  Dorsalis Pedis Palpable Palpable  Posterior Tibial Palpable Palpable   Gastrointestinal: soft, non-distended. No guarding/no peritoneal signs.  Musculoskeletal: Wheelchair-bound. No deformity or atrophy.  Neurologic: Pain and light touch intact in extremities.  Symmetrical.  Speech is fluent. Motor exam as listed above. Psychiatric: Judgment intact, Mood & affect appropriate for pt's clinical situation. Dermatologic:  Bilateral  stasis dermatitis, left lower extremity cellulitis Lymph : No Cervical lymphadenopathy, no lichenification or skin changes of chronic lymphedema.       ASSESSMENT AND PLAN:  1. Atherosclerosis of native artery of both lower extremities with intermittent claudication (HCC) Recommend:  I do not find evidence of arterial disease that would explain the patient's symptoms  The patient certainly has peripheral artery disease however it would not be consistent with the pain that the patient is currently describing.  We will have the patient return in 6 months with noninvasive studies, provided there is no change in status before then.  2. Hyperlipidemia, unspecified hyperlipidemia type Continue statin as ordered and reviewed, no changes at this time   3. Lymphedema Patient has severe edema of his left lower extremity.  Noninvasive study revealed no DVT or chronic venous insufficiency.  We will place the patient in [employer] wraps to assist with swelling.  The patient will have these changed on a weekly basis.  We will reevaluate her swelling in 4 weeks.  Based on patient's lymphedema, a lymph pump may be a useful adjunct treatment, we will consider on the patient returns for evaluation.   4. Cellulitis of left lower extremity Patient is lower extremity very erythematous, hot and swollen.  These changes are consistent with cellulitis.  We will treat the patient with a week of doxycycline and reevaluate when the patient comes in for [place] wraps to see if further antibiotics needed. - doxycycline (VIBRAMYCIN) 100 MG capsule; Take 1 capsule (100 mg total) by mouth 2 (two) times daily.  Dispense: 14 capsule; Refill: 0   Current Outpatient Medications on File Prior to Visit  Medication Sig Dispense Refill  . aspirin EC 81 MG tablet Take 1 tablet (81 mg total) by mouth daily. 150 tablet 2  . calcium gluconate 500 MG tablet Take 500 mg by mouth 2 (two) times daily.    Marland Kitchen donepezil (ARICEPT) 5 MG tablet  Take 5 mg by mouth daily.     Marland Kitchen  DUREZOL 0.05 % EMUL Place 1 drop into the left eye 2 (two) times daily.     . furosemide (LASIX) 20 MG tablet Take 20 mg by mouth daily.     Marland Kitchen. HYDROcodone-acetaminophen (NORCO/VICODIN) 5-325 MG tablet Take 1 tablet by mouth every 6 (six) hours as needed for moderate pain.     Marland Kitchen. ILEVRO 0.3 % ophthalmic suspension Place 1 drop into the left eye at bedtime.     Marland Kitchen. loperamide (IMODIUM A-D) 2 MG tablet Take 4 mg by mouth See admin instructions. Give 2 tablets after first loose stool and 1 tablet after each consecutive stool    . memantine (NAMENDA) 5 MG tablet Take 5 mg by mouth 2 (two) times daily.    . metolazone (ZAROXOLYN) 2.5 MG tablet Take 2.5 mg by mouth daily.    . metoprolol tartrate (LOPRESSOR) 25 MG tablet Take 25 mg by mouth 2 (two) times daily. 0800 & 2100    . moxifloxacin (VIGAMOX) 0.5 % ophthalmic solution Place 1 drop into the left eye 4 (four) times daily.    . ramipril (ALTACE) 1.25 MG capsule Take 1.25 mg by mouth daily.    . simvastatin (ZOCOR) 10 MG tablet Take 10 mg by mouth at bedtime.     . tamsulosin (FLOMAX) 0.4 MG CAPS capsule Take 0.4 mg by mouth daily.    . Vitamin D, Ergocalciferol, (DRISDOL) 50000 units CAPS capsule Take 50,000 Units by mouth every 30 (thirty) days. On the 5th of the month    . warfarin (COUMADIN) 5 MG tablet Take 5 mg by mouth at bedtime.      No current facility-administered medications on file prior to visit.     There are no Patient Instructions on file for this visit. No follow-ups on file.   Georgiana SpinnerFallon E Charli Liberatore, NP  This note was completed with Office managerDragon Dictation.  Any errors are purely unintentional.

## 2019-07-13 ENCOUNTER — Encounter (INDEPENDENT_AMBULATORY_CARE_PROVIDER_SITE_OTHER): Payer: Medicare Other

## 2019-07-20 ENCOUNTER — Encounter (INDEPENDENT_AMBULATORY_CARE_PROVIDER_SITE_OTHER): Payer: Medicare Other

## 2019-07-27 ENCOUNTER — Ambulatory Visit (INDEPENDENT_AMBULATORY_CARE_PROVIDER_SITE_OTHER): Payer: Medicare Other | Admitting: Nurse Practitioner

## 2019-08-03 ENCOUNTER — Emergency Department: Payer: Medicare Other

## 2019-08-03 ENCOUNTER — Emergency Department
Admission: EM | Admit: 2019-08-03 | Discharge: 2019-08-03 | Disposition: A | Discharge status: Home / Self Care | Payer: Medicare Other | Attending: Emergency Medicine | Admitting: Emergency Medicine

## 2019-08-03 ENCOUNTER — Other Ambulatory Visit: Payer: Self-pay

## 2019-08-03 DIAGNOSIS — R791 Abnormal coagulation profile: Secondary | ICD-10-CM | POA: Diagnosis not present

## 2019-08-03 DIAGNOSIS — W01198A Fall on same level from slipping, tripping and stumbling with subsequent striking against other object, initial encounter: Secondary | ICD-10-CM | POA: Diagnosis not present

## 2019-08-03 DIAGNOSIS — Y92129 Unspecified place in nursing home as the place of occurrence of the external cause: Secondary | ICD-10-CM | POA: Diagnosis not present

## 2019-08-03 DIAGNOSIS — Y998 Other external cause status: Secondary | ICD-10-CM | POA: Diagnosis not present

## 2019-08-03 DIAGNOSIS — Z950 Presence of cardiac pacemaker: Secondary | ICD-10-CM | POA: Insufficient documentation

## 2019-08-03 DIAGNOSIS — I110 Hypertensive heart disease with heart failure: Secondary | ICD-10-CM | POA: Diagnosis not present

## 2019-08-03 DIAGNOSIS — I509 Heart failure, unspecified: Secondary | ICD-10-CM | POA: Diagnosis not present

## 2019-08-03 DIAGNOSIS — Z7982 Long term (current) use of aspirin: Secondary | ICD-10-CM | POA: Diagnosis not present

## 2019-08-03 DIAGNOSIS — R4182 Altered mental status, unspecified: Secondary | ICD-10-CM | POA: Diagnosis present

## 2019-08-03 DIAGNOSIS — Z87891 Personal history of nicotine dependence: Secondary | ICD-10-CM | POA: Insufficient documentation

## 2019-08-03 DIAGNOSIS — F0390 Unspecified dementia without behavioral disturbance: Secondary | ICD-10-CM | POA: Diagnosis not present

## 2019-08-03 DIAGNOSIS — Z79899 Other long term (current) drug therapy: Secondary | ICD-10-CM | POA: Insufficient documentation

## 2019-08-03 DIAGNOSIS — Z8673 Personal history of transient ischemic attack (TIA), and cerebral infarction without residual deficits: Secondary | ICD-10-CM | POA: Diagnosis not present

## 2019-08-03 DIAGNOSIS — Z7901 Long term (current) use of anticoagulants: Secondary | ICD-10-CM | POA: Diagnosis not present

## 2019-08-03 DIAGNOSIS — I252 Old myocardial infarction: Secondary | ICD-10-CM | POA: Diagnosis not present

## 2019-08-03 DIAGNOSIS — Y9389 Activity, other specified: Secondary | ICD-10-CM | POA: Insufficient documentation

## 2019-08-03 LAB — URINALYSIS, COMPLETE (UACMP) WITH MICROSCOPIC
Bacteria, UA: NONE SEEN
Bilirubin Urine: NEGATIVE
Glucose, UA: NEGATIVE mg/dL
Ketones, ur: NEGATIVE mg/dL
Leukocytes,Ua: NEGATIVE
Nitrite: NEGATIVE
Protein, ur: NEGATIVE mg/dL
Specific Gravity, Urine: 1.008 (ref 1.005–1.030)
Squamous Epithelial / HPF: NONE SEEN (ref 0–5)
pH: 6 (ref 5.0–8.0)

## 2019-08-03 LAB — COMPREHENSIVE METABOLIC PANEL
ALT: 18 U/L (ref 0–44)
AST: 35 U/L (ref 15–41)
Albumin: 3.5 g/dL (ref 3.5–5.0)
Alkaline Phosphatase: 93 U/L (ref 38–126)
Anion gap: 14 (ref 5–15)
BUN: 31 mg/dL — ABNORMAL HIGH (ref 8–23)
CO2: 31 mmol/L (ref 22–32)
Calcium: 9.2 mg/dL (ref 8.9–10.3)
Chloride: 87 mmol/L — ABNORMAL LOW (ref 98–111)
Creatinine, Ser: 0.87 mg/dL (ref 0.61–1.24)
GFR calc Af Amer: >60 mL/min (ref >60)
GFR calc non Af Amer: >60 mL/min (ref >60)
Glucose, Bld: 104 mg/dL — ABNORMAL HIGH (ref 70–99)
Potassium: 3.3 mmol/L — ABNORMAL LOW (ref 3.5–5.1)
Sodium: 132 mmol/L — ABNORMAL LOW (ref 135–145)
Total Bilirubin: 1.5 mg/dL — ABNORMAL HIGH (ref 0.3–1.2)
Total Protein: 7.1 g/dL (ref 6.5–8.1)

## 2019-08-03 LAB — CBC WITH DIFFERENTIAL/PLATELET
Abs Immature Granulocytes: 0.04 10*3/uL (ref 0.00–0.07)
Basophils Absolute: 0 10*3/uL (ref 0.0–0.1)
Basophils Relative: 0 %
Eosinophils Absolute: 0 10*3/uL (ref 0.0–0.5)
Eosinophils Relative: 0 %
HCT: 42.3 % (ref 39.0–52.0)
Hemoglobin: 13.8 g/dL (ref 13.0–17.0)
Immature Granulocytes: 1 %
Lymphocytes Relative: 8 %
Lymphs Abs: 0.5 10*3/uL — ABNORMAL LOW (ref 0.7–4.0)
MCH: 25.8 pg — ABNORMAL LOW (ref 26.0–34.0)
MCHC: 32.6 g/dL (ref 30.0–36.0)
MCV: 79.1 fL — ABNORMAL LOW (ref 80.0–100.0)
Monocytes Absolute: 0.4 10*3/uL (ref 0.1–1.0)
Monocytes Relative: 7 %
Neutro Abs: 4.8 10*3/uL (ref 1.7–7.7)
Neutrophils Relative %: 84 %
Platelets: 146 10*3/uL — ABNORMAL LOW (ref 150–400)
RBC: 5.35 MIL/uL (ref 4.22–5.81)
RDW: 14.9 % (ref 11.5–15.5)
WBC: 5.8 10*3/uL (ref 4.0–10.5)
nRBC: 0 % (ref 0.0–0.2)

## 2019-08-03 LAB — PROTIME-INR
INR: 8.1 (ref 0.8–1.2)
Prothrombin Time: 66.1 seconds — ABNORMAL HIGH (ref 11.4–15.2)

## 2019-08-03 LAB — TROPONIN I (HIGH SENSITIVITY)
Troponin I (High Sensitivity): 29 ng/L — ABNORMAL HIGH (ref <18)
Troponin I (High Sensitivity): 28 ng/L — ABNORMAL HIGH (ref <18)

## 2019-08-03 NOTE — ED Notes (Signed)
Troponin drawn .

## 2019-08-03 NOTE — ED Notes (Signed)
Patient is lying on stretcher.  Responds to verbal--opens eyes a bit then goes back to sleep.  Sat is 100% on RA.

## 2019-08-03 NOTE — ED Notes (Addendum)
Patient from [place].  No cough or shortness of breath. Appears to be sleeping in no distress.   He will not open his eyes for me.  Pupils equal.  Looks like he hears me when I call his name but wont open eyes.   He has drssing on entire lower left leg.

## 2019-08-03 NOTE — ED Notes (Signed)
EMS called for transport.

## 2019-08-03 NOTE — ED Notes (Signed)
Patient is now alert.  He does not know da te, but  He knows he is at [facility].  He is askinh for something to eat an drink.

## 2019-08-03 NOTE — ED Notes (Addendum)
In and out cath for clear yellow urine.  He had tried to use urinal aobut 30 min ago without success and his diaper was dry at that time.  Now it was wet.  I cleaned and changed. He has open area with redness around it.  I cleaned area and placed a pink bottocks dressing over it.

## 2019-08-03 NOTE — ED Notes (Signed)
X-ray at bedside

## 2019-08-03 NOTE — ED Notes (Signed)
Family to bedside. Pt out of bed. RN Kathlee Nations in room to place pt back in bed.

## 2019-08-03 NOTE — ED Notes (Signed)
Rectal temp obtained. MD notified. Pt given ginger ale per request.

## 2019-08-03 NOTE — ED Notes (Signed)
inr 8.1

## 2019-08-03 NOTE — ED Triage Notes (Signed)
Pt arrives via EMS from [facility] after having a fall around 2AM- pt has a bruise on the tip of his nose, on his flank, and on his back per EMS- per facility pt is having AMS- pt tested positive for covid on the 21st- per faciltiy pt has been on O2 when he is not normally- per EMS pt was 94% on 6L- pt has a hx of afib- pt INR 11

## 2019-08-03 NOTE — Discharge Instructions (Addendum)
Please hold your Coumadin through the weekend.  Please have your doctor recheck your labs prior to initiating Coumadin once again.  Return to the emergency department for any symptoms personally concerning to yourself or staff members.

## 2019-08-03 NOTE — ED Provider Notes (Signed)
[facility] Emergency Department Provider Note  Time seen: 9:23 AM  I have reviewed the triage vital signs and the nursing notes.   HISTORY  Chief Complaint Altered Mental Status and Fall   HPI John Rodgers is a 73 y.o. male with a past medical history of dementia, CHF,  CAD, hypertension, hyperlipidemia, CVA with left-sided deficits presents to the emergency department for a fall and altered mental status.  According to EMS [facility] is [facility], had recently tested positive for Covid at the end of October.  They report patient had a fall around 2 AM this morning hitting his head.  They also report his labs had recently resulted showing a significant INR elevation.  Here the patient is quite somnolent but will awaken to voice, when asked if he has any pain he denies any pain, but then falls asleep shortly after.  Per EMS report patient is more somnolent and altered compared to his baseline.  Past Medical History:  Diagnosis Date  . Arthritis   . CHF (congestive heart failure) ([place])   . Coronary artery disease   . Dementia ([place])    VASCULAR  . Dementia ([place])   . Diarrhea    SEVERAL DAYS/ GIVEN MED TO STOP AT HOME HE LIVES IN  . Dysrhythmia    AFIB  . GERD (gastroesophageal reflux disease)   . Headache    MIGRAINE  . Heart murmur    SURGICAL REPAIR  . HOH (hard of hearing)   . Hypercholesterolemia   . Hypertension   . Myocardial infarction ([place])   . Neuromuscular disorder ([place])    MUSCLE WEAKNESS  . Osteoporosis   . Peripheral vascular disease ([place])   . Presence of permanent cardiac pacemaker   . Seizures ([place])    x1 years ago no longer on anti seizure medicines  . Stroke [facility]) 1996   more weakness left side CEREBELLAR STROKE SYNDROME  . Tunnel vision   . Vision loss    bil tunnel vision    Patient Active Problem List   Diagnosis Date Noted  . SOB (shortness of breath) 05/11/2019  . Anticoagulated on warfarin 02/15/2018  .  Carotid stenosis 01/17/2018  . Atherosclerosis of native arteries of extremity with intermittent claudication ([place]) 07/12/2017  . Other mechanical complication of femoral arterial graft (bypass), sequela 05/07/2017  . Leg pain 04/29/2017  . DJD (degenerative joint disease), lumbosacral 04/29/2017  . Peripheral artery disease ([place]) 03/23/2017  . Essential hypertension 03/23/2017  . Hyperlipidemia 03/23/2017  . Vascular dementia without behavioral disturbance ([place]) 07/29/2016  . Acute CHF (congestive heart failure) ([place]) 05/04/2016  . Disturbances of vision, late effect of stroke 06/25/2014  . Unspecified sequelae of cerebral infarction 04/24/2014  . Fitting or adjustment of cardiac pacemaker 06/13/2013  . Permanent atrial fibrillation ([place]) 09/02/2011  . Cardiac pacemaker in situ 08/10/2011  . Syncope 08/10/2011  . 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 CARDIAC VALVE SURGERY    . CATARACT EXTRACTION W/PHACO Left 12/28/2017   Procedure: CATARACT EXTRACTION PHACO AND INTRAOCULAR LENS PLACEMENT (IOC);  Surgeon: Birder Robson, MD;  Location: [facility] ORS;  Service: Ophthalmology;  Laterality: Left;  [place] 00:17.6 AP% 12.7 CDE 2.24 Fluid Pack LOt # [id number]  . CATARACT EXTRACTION W/PHACO Right 01/25/2018   Procedure: CATARACT EXTRACTION PHACO AND INTRAOCULAR LENS PLACEMENT (IOC);  Surgeon: Birder Robson, MD;  Location: [facility] ORS;  Service: Ophthalmology;  Laterality: Right;  [place] 00:27.0 AP% 14.1  CDE 3.81 Fluid Pack lot # [id number]  . HERNIA REPAIR    . INSERT / REPLACE / REMOVE PACEMAKER    . LOWER EXTREMITY ANGIOGRAPHY Left 05/11/2017   Procedure: Lower Extremity Angiography;  Surgeon: Renford Dills, MD;  Location: [facility] INVASIVE CV LAB;  Service: Cardiovascular;  Laterality: Left;  . LOWER EXTREMITY INTERVENTION  05/11/2017   Procedure: Lower Extremity Intervention;  Surgeon: Renford Dills, MD;  Location: [facility] INVASIVE CV LAB;   Service: Cardiovascular;;  . MURMUR     HEART REPAIR  . PERIPHERAL VASCULAR CATHETERIZATION Left 02/04/2016   Procedure: Lower Extremity Angiography;  Surgeon: Renford Dills, MD;  Location: [facility] INVASIVE CV LAB;  Service: Cardiovascular;  Laterality: Left;  . PERIPHERAL VASCULAR CATHETERIZATION  02/04/2016   Procedure: Lower Extremity Intervention;  Surgeon: Renford Dills, MD;  Location: [facility] INVASIVE CV LAB;  Service: Cardiovascular;;  . VASCULAR SURGERY      Prior to Admission medications   Medication Sig Start Date End Date Taking? Authorizing Provider  aspirin EC 81 MG tablet Take 1 tablet (81 mg total) by mouth daily. 02/05/16   Schnier, Latina Craver, MD  calcium gluconate 500 MG tablet Take 500 mg by mouth 2 (two) times daily.    [provider]  donepezil (ARICEPT) 5 MG tablet Take 5 mg by mouth daily.     [provider]  doxycycline (VIBRAMYCIN) 100 MG capsule Take 1 capsule (100 mg total) by mouth 2 (two) times daily. 07/06/19   Georgiana Spinner, NP  DUREZOL 0.05 % EMUL Place 1 drop into the left eye 2 (two) times daily.  12/20/17   [provider]  furosemide (LASIX) 20 MG tablet Take 20 mg by mouth daily.  12/24/16   [provider]  HYDROcodone-acetaminophen (NORCO/VICODIN) 5-325 MG tablet Take 1 tablet by mouth every 6 (six) hours as needed for moderate pain.     [provider]  ILEVRO 0.3 % ophthalmic suspension Place 1 drop into the left eye at bedtime.  12/20/17   [provider]  loperamide (IMODIUM A-D) 2 MG tablet Take 4 mg by mouth See admin instructions. Give 2 tablets after first loose stool and 1 tablet after each consecutive stool    [provider]  memantine (NAMENDA) 5 MG tablet Take 5 mg by mouth 2 (two) times daily.    [provider]  metolazone (ZAROXOLYN) 2.5 MG tablet Take 2.5 mg by mouth daily.    [provider]  metoprolol tartrate (LOPRESSOR) 25 MG tablet Take 25 mg by mouth 2  (two) times daily. 0800 & 2100 04/01/17   [provider]  moxifloxacin (VIGAMOX) 0.5 % ophthalmic solution Place 1 drop into the left eye 4 (four) times daily.    [provider]  ramipril (ALTACE) 1.25 MG capsule Take 1.25 mg by mouth daily.    [provider]  simvastatin (ZOCOR) 10 MG tablet Take 10 mg by mouth at bedtime.     [provider]  tamsulosin (FLOMAX) 0.4 MG CAPS capsule Take 0.4 mg by mouth daily. 05/04/17   [provider]  Vitamin D, Ergocalciferol, (DRISDOL) 50000 units CAPS capsule Take 50,000 Units by mouth every 30 (thirty) days. On the 5th of the month    [provider]  warfarin (COUMADIN) 5 MG tablet Take 5 mg by mouth at bedtime.  08/20/08   [provider]    No Known Allergies  No family history on file.  Social History Social  History   Tobacco Use  . Smoking status: Former Games developermoker  . Smokeless tobacco: Never Used  Substance Use Topics  . Alcohol use: No  . Drug use: No    Review of Systems Unable to obtain adequate/accurate review of systems secondary to altered mental status and dementia.  ____________________________________________   PHYSICAL EXAM:  VITAL SIGNS: ED Triage Vitals  Enc Vitals Group     BP 08/03/19 0912 (!) 146/62     Pulse Rate 08/03/19 0912 64     Resp 08/03/19 0912 17     Temp 08/03/19 0912 98 F (36.7 C)     Temp Source 08/03/19 0912 Oral     SpO2 08/03/19 0912 100 %     Weight 08/03/19 0913 210 lb (95.3 kg)     Height 08/03/19 0913 6' (1.829 m)     Head Circumference --      Peak Flow --      Pain Score --      Pain Loc --      Pain Edu? --      Excl. in GC? --    Constitutional: Patient is quite somnolent will awaken to voice and answer questions but then will fall back asleep.  Questionable accuracy for instance he did not recall falling last night. Eyes: Normal exam ENT      Head: Small bruise to bridge of nose.      Mouth/Throat: Mucous membranes  are moist. Cardiovascular: Normal rate, regular rhythm. Respiratory: Normal respiratory effort without tachypnea nor retractions. Breath sounds are clear without any obvious wheeze rales or rhonchi. Gastrointestinal: Soft and nontender. No distention.   Musculoskeletal: Nontender with normal range of motion in all extremities.  Able to move all extremities well without apparent discomfort. Neurologic: Patient appears to have normal speech but is quite somnolent.  Appears to be able to move all extremities. Skin: Skin is warm.  Small bruise to bridge of nose. Psychiatric: Mood and affect are normal.  ____________________________________________    EKG  EKG viewed and interpreted by myself shows a junctional rhythm at 61 bpm with a widened QRS, nonspecific ST changes   ____________________________________________    RADIOLOGY  CT scan of the head shows prior occipital infarcts with associated encephalomalacia.  No obvious mass or hemorrhage.  Chest x-ray shows mild to moderate congestive heart failure.  ____________________________________________   INITIAL IMPRESSION / ASSESSMENT AND PLAN / ED COURSE  Pertinent labs & imaging results that were available during my care of the patient were reviewed by me and considered in my medical decision making (see chart for details).   Patient presents to the emergency department for reported fall and altered mental status.  Differential is quite broad but would include worsening Covid symptoms, CHF exacerbation, fall, head injury or ICH, elevated INR.  We will check labs, chest x-ray, CT scan of the head and continue to closely monitor.  Patient's work-up is largely nonrevealing.  Analysis has resulted negative.  Troponin is unchanged x2.  Chest x-ray does show mild changes consistent with CHF.  Patient has had low pulse ox readings however there with poor waveforms.  When the patient has a good waveform he reads 99% on room air.  I discussed  the patient with his nurse at his nursing facility, states he has oxygen ordered as needed, and states he typically does wear 2 L of oxygen at the nursing facility.  Their main concern was his INR was elevated greater than 11 2 days ago  they discontinued his Coumadin for the next several days, but since he had a fall they were concerned about intracranial hemorrhage.  Patient CT scan is negative for intracranial hemorrhage.  INR is currently 8.1.  We will continue to hold Coumadin through the weekend.  Patient will follow-up with his PCP at his nursing facility.  John Rodgers was evaluated in Emergency Department on 08/03/2019 for the symptoms described in the history of present illness. He was evaluated in the context of the global COVID-19 pandemic, which necessitated consideration that the patient might be at risk for infection with the SARS-CoV-2 virus that causes COVID-19. Institutional protocols and algorithms that pertain to the evaluation of patients at risk for COVID-19 are in a state of rapid change based on information released by regulatory bodies including the CDC and federal and state organizations. These policies and algorithms were followed during the patient's care in the ED.  ____________________________________________   FINAL CLINICAL IMPRESSION(S) / ED DIAGNOSES  Supratherapeutic INR Thresa Ross, MD 08/03/19 1346

## 2019-08-03 NOTE — ED Notes (Signed)
Dr Kerman Passey made aware, INR 8.1, acknowledged.

## 2019-08-03 NOTE — ED Notes (Signed)
Family left number and has gone home.

## 2019-08-07 ENCOUNTER — Emergency Department: Payer: Medicare Other

## 2019-08-07 ENCOUNTER — Inpatient Hospital Stay
Admission: EM | Admit: 2019-08-07 | Discharge: 2019-08-11 | DRG: 083 | Disposition: A | Discharge status: Skilled Nursing Facility | Payer: Medicare Other | Attending: Internal Medicine | Admitting: Internal Medicine

## 2019-08-07 ENCOUNTER — Inpatient Hospital Stay: Payer: Medicare Other

## 2019-08-07 ENCOUNTER — Other Ambulatory Visit: Payer: Self-pay

## 2019-08-07 ENCOUNTER — Encounter: Payer: Self-pay | Admitting: Emergency Medicine

## 2019-08-07 DIAGNOSIS — Z9889 Other specified postprocedural states: Secondary | ICD-10-CM

## 2019-08-07 DIAGNOSIS — I110 Hypertensive heart disease with heart failure: Secondary | ICD-10-CM | POA: Diagnosis present

## 2019-08-07 DIAGNOSIS — I5032 Chronic diastolic (congestive) heart failure: Secondary | ICD-10-CM | POA: Diagnosis not present

## 2019-08-07 DIAGNOSIS — Z7401 Bed confinement status: Secondary | ICD-10-CM

## 2019-08-07 DIAGNOSIS — Z79899 Other long term (current) drug therapy: Secondary | ICD-10-CM

## 2019-08-07 DIAGNOSIS — K219 Gastro-esophageal reflux disease without esophagitis: Secondary | ICD-10-CM | POA: Diagnosis present

## 2019-08-07 DIAGNOSIS — J90 Pleural effusion, not elsewhere classified: Secondary | ICD-10-CM

## 2019-08-07 DIAGNOSIS — I739 Peripheral vascular disease, unspecified: Secondary | ICD-10-CM | POA: Diagnosis not present

## 2019-08-07 DIAGNOSIS — I2510 Atherosclerotic heart disease of native coronary artery without angina pectoris: Secondary | ICD-10-CM | POA: Diagnosis present

## 2019-08-07 DIAGNOSIS — I248 Other forms of acute ischemic heart disease: Secondary | ICD-10-CM | POA: Diagnosis present

## 2019-08-07 DIAGNOSIS — Z7189 Other specified counseling: Secondary | ICD-10-CM

## 2019-08-07 DIAGNOSIS — M810 Age-related osteoporosis without current pathological fracture: Secondary | ICD-10-CM | POA: Diagnosis present

## 2019-08-07 DIAGNOSIS — G9349 Other encephalopathy: Secondary | ICD-10-CM | POA: Diagnosis present

## 2019-08-07 DIAGNOSIS — Z66 Do not resuscitate: Secondary | ICD-10-CM | POA: Diagnosis present

## 2019-08-07 DIAGNOSIS — E871 Hypo-osmolality and hyponatremia: Secondary | ICD-10-CM

## 2019-08-07 DIAGNOSIS — L8990 Pressure ulcer of unspecified site, unspecified stage: Secondary | ICD-10-CM | POA: Insufficient documentation

## 2019-08-07 DIAGNOSIS — R471 Dysarthria and anarthria: Secondary | ICD-10-CM | POA: Diagnosis present

## 2019-08-07 DIAGNOSIS — S065XAA Traumatic subdural hemorrhage with loss of consciousness status unknown, initial encounter: Secondary | ICD-10-CM

## 2019-08-07 DIAGNOSIS — R4182 Altered mental status, unspecified: Secondary | ICD-10-CM | POA: Diagnosis not present

## 2019-08-07 DIAGNOSIS — R0902 Hypoxemia: Secondary | ICD-10-CM | POA: Diagnosis present

## 2019-08-07 DIAGNOSIS — I69354 Hemiplegia and hemiparesis following cerebral infarction affecting left non-dominant side: Secondary | ICD-10-CM | POA: Diagnosis not present

## 2019-08-07 DIAGNOSIS — E876 Hypokalemia: Secondary | ICD-10-CM | POA: Diagnosis not present

## 2019-08-07 DIAGNOSIS — Z8619 Personal history of other infectious and parasitic diseases: Secondary | ICD-10-CM

## 2019-08-07 DIAGNOSIS — W19XXXA Unspecified fall, initial encounter: Secondary | ICD-10-CM | POA: Diagnosis present

## 2019-08-07 DIAGNOSIS — H9190 Unspecified hearing loss, unspecified ear: Secondary | ICD-10-CM | POA: Diagnosis present

## 2019-08-07 DIAGNOSIS — Z515 Encounter for palliative care: Secondary | ICD-10-CM

## 2019-08-07 DIAGNOSIS — R569 Unspecified convulsions: Secondary | ICD-10-CM | POA: Diagnosis present

## 2019-08-07 DIAGNOSIS — I4821 Permanent atrial fibrillation: Secondary | ICD-10-CM | POA: Diagnosis not present

## 2019-08-07 DIAGNOSIS — Z87898 Personal history of other specified conditions: Secondary | ICD-10-CM | POA: Diagnosis not present

## 2019-08-07 DIAGNOSIS — L89152 Pressure ulcer of sacral region, stage 2: Secondary | ICD-10-CM | POA: Diagnosis present

## 2019-08-07 DIAGNOSIS — T45515A Adverse effect of anticoagulants, initial encounter: Secondary | ICD-10-CM | POA: Diagnosis present

## 2019-08-07 DIAGNOSIS — E785 Hyperlipidemia, unspecified: Secondary | ICD-10-CM | POA: Diagnosis present

## 2019-08-07 DIAGNOSIS — Z950 Presence of cardiac pacemaker: Secondary | ICD-10-CM | POA: Diagnosis present

## 2019-08-07 DIAGNOSIS — H548 Legal blindness, as defined in USA: Secondary | ICD-10-CM | POA: Diagnosis present

## 2019-08-07 DIAGNOSIS — Z7901 Long term (current) use of anticoagulants: Secondary | ICD-10-CM

## 2019-08-07 DIAGNOSIS — R791 Abnormal coagulation profile: Secondary | ICD-10-CM | POA: Diagnosis present

## 2019-08-07 DIAGNOSIS — E860 Dehydration: Secondary | ICD-10-CM | POA: Diagnosis present

## 2019-08-07 DIAGNOSIS — I252 Old myocardial infarction: Secondary | ICD-10-CM

## 2019-08-07 DIAGNOSIS — Z87891 Personal history of nicotine dependence: Secondary | ICD-10-CM

## 2019-08-07 DIAGNOSIS — I10 Essential (primary) hypertension: Secondary | ICD-10-CM | POA: Diagnosis present

## 2019-08-07 DIAGNOSIS — S065X9A Traumatic subdural hemorrhage with loss of consciousness of unspecified duration, initial encounter: Principal | ICD-10-CM | POA: Diagnosis present

## 2019-08-07 DIAGNOSIS — I872 Venous insufficiency (chronic) (peripheral): Secondary | ICD-10-CM | POA: Diagnosis present

## 2019-08-07 DIAGNOSIS — F0150 Vascular dementia without behavioral disturbance: Secondary | ICD-10-CM | POA: Diagnosis present

## 2019-08-07 DIAGNOSIS — Z7982 Long term (current) use of aspirin: Secondary | ICD-10-CM

## 2019-08-07 LAB — CBC WITH DIFFERENTIAL/PLATELET
Abs Immature Granulocytes: 0.07 10*3/uL (ref 0.00–0.07)
Basophils Absolute: 0 10*3/uL (ref 0.0–0.1)
Basophils Relative: 0 %
Eosinophils Absolute: 0 10*3/uL (ref 0.0–0.5)
Eosinophils Relative: 0 %
HCT: 36.5 % — ABNORMAL LOW (ref 39.0–52.0)
Hemoglobin: 12.4 g/dL — ABNORMAL LOW (ref 13.0–17.0)
Immature Granulocytes: 1 %
Lymphocytes Relative: 6 %
Lymphs Abs: 0.5 10*3/uL — ABNORMAL LOW (ref 0.7–4.0)
MCH: 25.9 pg — ABNORMAL LOW (ref 26.0–34.0)
MCHC: 34 g/dL (ref 30.0–36.0)
MCV: 76.4 fL — ABNORMAL LOW (ref 80.0–100.0)
Monocytes Absolute: 0.5 10*3/uL (ref 0.1–1.0)
Monocytes Relative: 7 %
Neutro Abs: 6.2 10*3/uL (ref 1.7–7.7)
Neutrophils Relative %: 86 %
Platelets: 139 10*3/uL — ABNORMAL LOW (ref 150–400)
RBC: 4.78 MIL/uL (ref 4.22–5.81)
RDW: 14.9 % (ref 11.5–15.5)
WBC: 7.3 10*3/uL (ref 4.0–10.5)
nRBC: 0 % (ref 0.0–0.2)

## 2019-08-07 LAB — LACTATE DEHYDROGENASE: LDH: 229 U/L — ABNORMAL HIGH (ref 98–192)

## 2019-08-07 LAB — COMPREHENSIVE METABOLIC PANEL
ALT: 21 U/L (ref 0–44)
AST: 43 U/L — ABNORMAL HIGH (ref 15–41)
Albumin: 3.5 g/dL (ref 3.5–5.0)
Alkaline Phosphatase: 91 U/L (ref 38–126)
Anion gap: 13 (ref 5–15)
BUN: 32 mg/dL — ABNORMAL HIGH (ref 8–23)
CO2: 32 mmol/L (ref 22–32)
Calcium: 9 mg/dL (ref 8.9–10.3)
Chloride: 85 mmol/L — ABNORMAL LOW (ref 98–111)
Creatinine, Ser: 0.84 mg/dL (ref 0.61–1.24)
GFR calc Af Amer: >60 mL/min (ref >60)
GFR calc non Af Amer: >60 mL/min (ref >60)
Glucose, Bld: 111 mg/dL — ABNORMAL HIGH (ref 70–99)
Potassium: 3.1 mmol/L — ABNORMAL LOW (ref 3.5–5.1)
Sodium: 130 mmol/L — ABNORMAL LOW (ref 135–145)
Total Bilirubin: 2.2 mg/dL — ABNORMAL HIGH (ref 0.3–1.2)
Total Protein: 7.5 g/dL (ref 6.5–8.1)

## 2019-08-07 LAB — PROTIME-INR
INR: 2.3 — ABNORMAL HIGH (ref 0.8–1.2)
INR: 1.4 — ABNORMAL HIGH (ref 0.8–1.2)
Prothrombin Time: 24.9 seconds — ABNORMAL HIGH (ref 11.4–15.2)
Prothrombin Time: 16.7 seconds — ABNORMAL HIGH (ref 11.4–15.2)

## 2019-08-07 LAB — TRIGLYCERIDES: Triglycerides: 88 mg/dL (ref <150)

## 2019-08-07 LAB — FERRITIN: Ferritin: 300 ng/mL (ref 24–336)

## 2019-08-07 LAB — FIBRINOGEN: Fibrinogen: 700 mg/dL — ABNORMAL HIGH (ref 210–475)

## 2019-08-07 LAB — LACTIC ACID, PLASMA
Lactic Acid, Venous: 1.5 mmol/L (ref 0.5–1.9)
Lactic Acid, Venous: 1.3 mmol/L (ref 0.5–1.9)

## 2019-08-07 LAB — TROPONIN I (HIGH SENSITIVITY)
Troponin I (High Sensitivity): 49 ng/L — ABNORMAL HIGH (ref <18)
Troponin I (High Sensitivity): 37 ng/L — ABNORMAL HIGH (ref <18)

## 2019-08-07 LAB — BRAIN NATRIURETIC PEPTIDE: B Natriuretic Peptide: 463 pg/mL — ABNORMAL HIGH (ref 0.0–100.0)

## 2019-08-07 LAB — FIBRIN DERIVATIVES D-DIMER (ARMC ONLY): Fibrin derivatives D-dimer (ARMC): 1230.98 ng/mL (FEU) — ABNORMAL HIGH (ref 0.00–499.00)

## 2019-08-07 LAB — C-REACTIVE PROTEIN: CRP: 12.2 mg/dL — ABNORMAL HIGH (ref <1.0)

## 2019-08-07 LAB — PROCALCITONIN: Procalcitonin: 0.15 ng/mL

## 2019-08-07 MED ORDER — ONDANSETRON HCL 4 MG/2ML IJ SOLN: 4.0000 mg | Freq: Four times a day (QID) | INTRAMUSCULAR | Status: DC | PRN

## 2019-08-07 MED ORDER — ACETAMINOPHEN 650 MG RE SUPP: 650.0000 mg | Freq: Four times a day (QID) | RECTAL | Status: DC | PRN

## 2019-08-07 MED ORDER — SODIUM CHLORIDE 0.9 % IV SOLN
Freq: Once | INTRAVENOUS | Status: AC
  Administered 2019-08-07: 10:00:00 via INTRAVENOUS

## 2019-08-07 MED ORDER — NALOXONE HCL 2 MG/2ML IJ SOSY: 0.4000 mg | PREFILLED_SYRINGE | Freq: Once | INTRAMUSCULAR | Status: DC

## 2019-08-07 MED ORDER — PIPERACILLIN-TAZOBACTAM 3.375 G IVPB 30 MIN
3.3750 g | Freq: Once | INTRAVENOUS | Status: AC
  Administered 2019-08-07: 3.375 g via INTRAVENOUS
  Filled 2019-08-07: qty 50

## 2019-08-07 MED ORDER — LABETALOL HCL 5 MG/ML IV SOLN: 10.0000 mg | Freq: Four times a day (QID) | INTRAVENOUS | Status: DC | PRN

## 2019-08-07 MED ORDER — ACETAMINOPHEN 325 MG PO TABS
650.0000 mg | ORAL_TABLET | Freq: Four times a day (QID) | ORAL | Status: DC | PRN
  Administered 2019-08-09: 650 mg via ORAL
  Filled 2019-08-07: qty 2

## 2019-08-07 MED ORDER — VANCOMYCIN HCL IN DEXTROSE 1-5 GM/200ML-% IV SOLN
1000.0000 mg | Freq: Once | INTRAVENOUS | Status: AC
  Administered 2019-08-07: 1000 mg via INTRAVENOUS
  Filled 2019-08-07: qty 200

## 2019-08-07 MED ORDER — VITAMIN K1 10 MG/ML IJ SOLN
10.0000 mg | Freq: Once | INTRAVENOUS | Status: AC
  Administered 2019-08-07: 10 mg via INTRAVENOUS
  Filled 2019-08-07: qty 1

## 2019-08-07 MED ORDER — PROTHROMBIN COMPLEX CONC HUMAN 500 UNITS IV KIT
1500.0000 [IU] | PACK | Status: DC
  Filled 2019-08-07: qty 1500

## 2019-08-07 MED ORDER — PROTHROMBIN COMPLEX CONC HUMAN 500 UNITS IV KIT
1568.0000 [IU] | PACK | Status: AC
  Administered 2019-08-07: 1568 [IU] via INTRAVENOUS
  Filled 2019-08-07: qty 1568

## 2019-08-07 MED ORDER — KCL IN DEXTROSE-NACL 40-5-0.9 MEQ/L-%-% IV SOLN
INTRAVENOUS | Status: DC
  Administered 2019-08-07 – 2019-08-09 (×4): via INTRAVENOUS
  Filled 2019-08-07 (×11): qty 1000

## 2019-08-07 MED ORDER — SODIUM CHLORIDE 0.9 % IV SOLN
10.0000 mL/h | Freq: Once | INTRAVENOUS | Status: AC
  Administered 2019-08-07: 10 mL/h via INTRAVENOUS

## 2019-08-07 MED ORDER — ONDANSETRON HCL 4 MG PO TABS: 4.0000 mg | ORAL_TABLET | Freq: Four times a day (QID) | ORAL | Status: DC | PRN

## 2019-08-07 NOTE — ED Notes (Signed)
Pt cleaned up and repositioned in bed at this time. Pt had small bowel movement yellow in color.

## 2019-08-07 NOTE — ED Triage Notes (Signed)
Patient arrives via EMS original for low SAT. Noted difficult SAT pick up finger. SAT 98 room air with nasal SAT device. Patient weak, denies pain.

## 2019-08-07 NOTE — ED Notes (Signed)
MD admitting at bedside. 

## 2019-08-07 NOTE — ED Notes (Signed)
Pt repositioned and brief changed in bed by this RN and Health visitor.

## 2019-08-07 NOTE — ED Notes (Signed)
Pt has small bowel movement and had wet brief. Pt changed and sheets changed. Pt repositioned in bed, resting comfortably at this time.

## 2019-08-07 NOTE — ED Notes (Signed)
Pt unable to sign for consent.

## 2019-08-07 NOTE — Progress Notes (Signed)
MEDICATION RELATED MONITORING NOTE   Pharmacy Monitoring post-Kcentra Administration Indication: Subdural hematoma in patient on warfarin  Vital Signs: Temp: 97.1 F (36.2 C) (11/09 1244) Temp Source: Oral (11/09 1244) BP: 162/140 (11/09 1603) Pulse Rate: 64 (11/09 1603)  Labs: Recent Labs    08/07/19 0931  WBC 7.3  HGB 12.4*  HCT 36.5*  PLT 139*  CREATININE 0.84  ALBUMIN 3.5  PROT 7.5  AST 43*  ALT 21  ALKPHOS 91  BILITOT 2.2*   Estimated Creatinine Clearance: 80.9 mL/min (by C-G formula based on SCr of 0.84 mg/dL).  Medical History: Past Medical History:  Diagnosis Date  . Arthritis   . CHF (congestive heart failure) ([place])   . Coronary artery disease   . Dementia ([place])    VASCULAR  . Dementia ([place])   . Diarrhea    SEVERAL DAYS/ GIVEN MED TO STOP AT HOME HE LIVES IN  . Dysrhythmia    AFIB  . GERD (gastroesophageal reflux disease)   . Headache    MIGRAINE  . Heart murmur    SURGICAL REPAIR  . HOH (hard of hearing)   . Hypercholesterolemia   . Hypertension   . Myocardial infarction ([place])   . Neuromuscular disorder ([place])    MUSCLE WEAKNESS  . Osteoporosis   . Peripheral vascular disease ([place])   . Presence of permanent cardiac pacemaker   . Seizures ([place])    x1 years ago no longer on anti seizure medicines  . Stroke [facility]) 1996   more weakness left side CEREBELLAR STROKE SYNDROME  . Tunnel vision   . Vision loss    bil tunnel vision    Medications:  Warfarin for atrial fibrillation (held since 11/3)  Assessment: Patient is a 73 y/o M with hx including dementia, CVA, blindness, atrial fibrillation on warfarin and with pacemaker, CAD, hx of seizures not on AEDs who presented to [facility] from facility with generalized weakness, hypoxia, decreased sensorium. Patient with recent supratherapeutic INR to 11 at facility on 11/3 and warfarin was held. Patient sustained fall on 11/5 and was seen in ED and found to have INR of 8.1. Warfarin held and patient  discharged back to SNF. Returned to ED 11/9 for aforementioned reasons and found to have subdural hematoma and was treated with Kcentra and vitamin K.    11/9 INR 2.3  11/9 INR 1.4 (post-Kcentra)  Plan:  -INR q6h x 4 then daily x 2 per post-Kcentra monitoring protocol  [place] Resident 08/07/2019,5:47 PM

## 2019-08-07 NOTE — ED Provider Notes (Addendum)
[facility] Emergency Department Provider Note       Time seen: ----------------------------------------- 9:08 AM on 08/07/2019 ----------------------------------------- Level V caveat: History/ROS limited by altered mental status  I have reviewed the triage vital signs and the nursing notes.  HISTORY   Chief Complaint No chief complaint on file.   HPI John Rodgers is a 73 y.o. male with a history of arthritis, CHF, dementia, hyperlipidemia, hypertension, MI, peripheral vascular disease, seizures who presents to the ED for altered mental status.  Patient was reportedly Covid positive.  He was sent here for altered mental status and reported hypoxia.  He has had normoxia here.  Patient cannot give further review of systems or report.  Past Medical History:  Diagnosis Date  . Arthritis   . CHF (congestive heart failure) (HCC)   . Coronary artery disease   . Dementia (HCC)    VASCULAR  . Dementia (HCC)   . Diarrhea    SEVERAL DAYS/ GIVEN MED TO STOP AT HOME HE LIVES IN  . Dysrhythmia    AFIB  . GERD (gastroesophageal reflux disease)   . Headache    MIGRAINE  . Heart murmur    SURGICAL REPAIR  . HOH (hard of hearing)   . Hypercholesterolemia   . Hypertension   . Myocardial infarction (HCC)   . Neuromuscular disorder (HCC)    MUSCLE WEAKNESS  . Osteoporosis   . Peripheral vascular disease (HCC)   . Presence of permanent cardiac pacemaker   . Seizures (HCC)    x1 years ago no longer on anti seizure medicines  . Stroke [facility](HCC) 1996   more weakness left side CEREBELLAR STROKE SYNDROME  . Tunnel vision   . Vision loss    bil tunnel vision    Patient Active Problem List   Diagnosis Date Noted  . SOB (shortness of breath) 05/11/2019  . Anticoagulated on warfarin 02/15/2018  . Carotid stenosis 01/17/2018  . Atherosclerosis of native arteries of extremity with intermittent claudication (HCC) 07/12/2017  . Other mechanical complication of femoral  arterial graft (bypass), sequela 05/07/2017  . Leg pain 04/29/2017  . DJD (degenerative joint disease), lumbosacral 04/29/2017  . Peripheral artery disease (HCC) 03/23/2017  . Essential hypertension 03/23/2017  . Hyperlipidemia 03/23/2017  . Vascular dementia without behavioral disturbance (HCC) 07/29/2016  . Acute CHF (congestive heart failure) (HCC) 05/04/2016  . Disturbances of vision, late effect of stroke 06/25/2014  . Unspecified sequelae of cerebral infarction 04/24/2014  . Fitting or adjustment of cardiac pacemaker 06/13/2013  . Permanent atrial fibrillation (HCC) 09/02/2011  . Cardiac pacemaker in situ 08/10/2011  . Syncope 08/10/2011  . 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 CARDIAC VALVE SURGERY    . CATARACT EXTRACTION W/PHACO Left 12/28/2017   Procedure: CATARACT EXTRACTION PHACO AND INTRAOCULAR LENS PLACEMENT (IOC);  Surgeon: Galen ManilaPorfilio, William, MD;  Location: [facility] ORS;  Service: Ophthalmology;  Laterality: Left;  US 00:17.6 AP% 12.7 CDE 2.24 Fluid Pack LOt # [id number]  . CATARACT EXTRACTION W/PHACO Right 01/25/2018   Procedure: CATARACT EXTRACTION PHACO AND INTRAOCULAR LENS PLACEMENT (IOC);  Surgeon: Galen ManilaPorfilio, William, MD;  Location: [facility] ORS;  Service: Ophthalmology;  Laterality: Right;  US 00:27.0 AP% 14.1 CDE 3.81 Fluid Pack lot # [id number]  . HERNIA REPAIR    . INSERT / REPLACE / REMOVE PACEMAKER    . LOWER EXTREMITY ANGIOGRAPHY Left 05/11/2017   Procedure: Lower Extremity Angiography;  Surgeon: Renford DillsSchnier, Gregory G, MD;  [facility];  Service: Cardiovascular;  Laterality: Left;  . LOWER EXTREMITY INTERVENTION  05/11/2017   Procedure: Lower Extremity Intervention;  Surgeon: Renford Dills, MD;  Location: [facility] INVASIVE CV LAB;  Service: Cardiovascular;;  . MURMUR     HEART REPAIR  . PERIPHERAL VASCULAR CATHETERIZATION Left 02/04/2016   Procedure: Lower Extremity Angiography;  Surgeon: Renford Dills, MD;  Location: [facility] INVASIVE CV LAB;  Service: Cardiovascular;  Laterality: Left;  . PERIPHERAL VASCULAR CATHETERIZATION  02/04/2016   Procedure: Lower Extremity Intervention;  Surgeon: Renford Dills, MD;  Location: [facility] INVASIVE CV LAB;  Service: Cardiovascular;;  . VASCULAR SURGERY      Allergies Patient has no known allergies.  Social History Social History   Tobacco Use  . Smoking status: Former Games developer  . Smokeless tobacco: Never Used  Substance Use Topics  . Alcohol use: No  . Drug use: No    Review of Systems Skin: Positive for bruising Positive for altered mental status Review of systems otherwise unknown All systems negative/normal/unremarkable except as stated in the HPI  ____________________________________________   PHYSICAL EXAM:  VITAL SIGNS: ED Triage Vitals  Enc Vitals Group     BP      Pulse      Resp      Temp      Temp src      SpO2      Weight      Height      Head Circumference      Peak Flow      Pain Score      Pain Loc      Pain Edu?      Excl. in GC?     Constitutional: Lethargic but arouses to verbal or painful stimuli.  No acute distress Eyes: Conjunctivae are normal. Normal extraocular movements. ENT      Head: Normocephalic and atraumatic.      Nose: No congestion/rhinnorhea.      Mouth/Throat: Mucous membranes are moist.      Neck: No stridor. Cardiovascular: Normal rate, regular rhythm. No murmurs, rubs, or gallops. Respiratory: Normal respiratory effort without tachypnea nor retractions. Breath sounds are clear and equal bilaterally. No wheezes/rales/rhonchi. Gastrointestinal: Soft and nontender. Normal bowel sounds Musculoskeletal: Limited range of motion of the extremities Neurologic:  Normal speech and language. No gross focal neurologic deficits are appreciated.  Skin: Extensive contusions are noted over his extremities Psychiatric: Confused, cannot participate with  examination. ____________________________________________  EKG: Interpreted by me.  Baseline artifact, likely underlying sinus rhythm, normal axis, ST depressions, right bundle branch block  ____________________________________________  ED COURSE:  As part of my medical decision making, I reviewed the following data within the electronic MEDICAL RECORD NUMBER History obtained from family if available, nursing notes, old chart and ekg, as well as notes from prior ED visits. Patient presented for altered mental status with COVID-19, we will assess with labs and imaging as indicated at this time.   Procedures  Serge Main was evaluated in Emergency Department on 08/07/2019 for the symptoms described in the history of present illness. He was evaluated in the context of the global COVID-19 pandemic, which necessitated consideration that the patient might be at risk for infection with the SARS-CoV-2 virus that causes COVID-19. Institutional protocols and algorithms that pertain to the evaluation of patients at risk for COVID-19 are in a state of rapid change based on information released by regulatory bodies including the CDC and federal and state organizations. These policies and  algorithms were followed during the patient's care in the ED.  ____________________________________________   LABS (pertinent positives/negatives)  Labs Reviewed  CBC WITH DIFFERENTIAL/PLATELET - Abnormal; Notable for the following components:      Result Value   Hemoglobin 12.4 (*)    HCT 36.5 (*)    MCV 76.4 (*)    MCH 25.9 (*)    Platelets 139 (*)    Lymphs Abs 0.5 (*)    All other components within normal limits  COMPREHENSIVE METABOLIC PANEL - Abnormal; Notable for the following components:   Sodium 130 (*)    Potassium 3.1 (*)    Chloride 85 (*)    Glucose, Bld 111 (*)    BUN 32 (*)    AST 43 (*)    Total Bilirubin 2.2 (*)    All other components within normal limits  FIBRIN DERIVATIVES D-DIMER ([facility] ONLY) -  Abnormal; Notable for the following components:   Fibrin derivatives D-dimer ([facility]) 1,230.98 (*)    All other components within normal limits  LACTATE DEHYDROGENASE - Abnormal; Notable for the following components:   LDH 229 (*)    All other components within normal limits  FIBRINOGEN - Abnormal; Notable for the following components:   Fibrinogen 700 (*)    All other components within normal limits  BRAIN NATRIURETIC PEPTIDE - Abnormal; Notable for the following components:   B Natriuretic Peptide 463.0 (*)    All other components within normal limits  PROTIME-INR - Abnormal; Notable for the following components:   Prothrombin Time 24.9 (*)    INR 2.3 (*)    All other components within normal limits  TROPONIN I (HIGH SENSITIVITY) - Abnormal; Notable for the following components:   Troponin I (High Sensitivity) 49 (*)    All other components within normal limits  CULTURE, BLOOD (ROUTINE X 2)  CULTURE, BLOOD (ROUTINE X 2)  LACTIC ACID, PLASMA  LACTIC ACID, PLASMA  PROCALCITONIN  FERRITIN  TRIGLYCERIDES  C-REACTIVE PROTEIN  URINALYSIS, COMPLETE (UACMP) WITH MICROSCOPIC  TROPONIN I (HIGH SENSITIVITY)   CRITICAL CARE Performed by: Ulice Dash   Total critical care time: 30 minutes  Critical care time was exclusive of separately billable procedures and treating other patients.  Critical care was necessary to treat or prevent imminent or life-threatening deterioration.  Critical care was time spent personally by me on the following activities: development of treatment plan with patient and/or surrogate as well as nursing, discussions with consultants, evaluation of patient's response to treatment, examination of patient, obtaining history from patient or surrogate, ordering and performing treatments and interventions, ordering and review of laboratory studies, ordering and review of radiographic studies, pulse oximetry and re-evaluation of patient's  condition.  RADIOLOGY Images were viewed by me  Chest x-ray, CT head IMPRESSION:  Moderate to moderately large right pleural effusion with compressive  atelectasis in the left lung base.   Cardiomegaly without edema.  IMPRESSION: This examination is positive for acute subdural hemorrhage over the right convexities. The largest volume of hemorrhage overlies encephalomalacia in the right occipital and temporal lobes where the patient has a remote infarct. Negative for midline shift or hydrocephalus.  Critical Value/emergent results were called by telephone at the time of interpretation on 08/07/2019 at 11:13 am to [facility] , who verbally acknowledged these results. ____________________________________________   DIFFERENTIAL DIAGNOSIS   COVID-19, dehydration, electrolyte abnormality, sepsis, CVA, MI  FINAL ASSESSMENT AND PLAN  COVID-19, altered mental status, acute subdural hemorrhage   Plan: The patient had presented  for altered mental status. Patient's labs were grossly unremarkable, he did have some dehydration and his troponin was elevated which is likely demand related. Patient's imaging initially and chest x-ray revealed moderate right pleural effusion, CT indicated likely etiology for his altered mental status which revealed acute subdural hemorrhage over the right convexities.  Given that he is on Coumadin, we will manage this with prothrombin complex concentrate.  We will also give vitamin K 10 mg IV.  He is almost exactly 3 weeks from his Covid testing.  Family wishes for me to keep him here.  According to infectious disease he is not considered contagious at this time and we can keep him here.  I have discussed with neurosurgery who recommends repeat CT in 6 hours.   Laurence Aly, MD    Note: This note was generated in part or whole with voice recognition software. Voice recognition is usually quite accurate but there are transcription errors  that can and very often do occur. I apologize for any typographical errors that were not detected and corrected.     Earleen Newport, MD 08/07/19 1124    Earleen Newport, MD 08/07/19 1248

## 2019-08-07 NOTE — H&P (Signed)
History and Physical  Patient Name: John Rodgers     WGN:[id number]    DOB: 06-14-1946    DOA: 08/07/2019 PCP: Juluis Pitch, MD  Patient coming from: [facility]  Chief Complaint: Decreased sensorium      HPI: John Rodgers is a 73 y.o. M with hx dementia, SNF swelling, CVA left-sided weakness, blindness, dCHF, atrial fibrillation on warfarin pacemaker in place, CAD, HTN, history of seizures not on AEDs, and history of venous insufficiency ulcers who presented with generalized weakness, hypoxia, decreased sensorium, possibly over days.  Caveat the patient is unable to provide independent history, all history collected from nephew by phone.  Patient had tested positive for COVID on 10/19 at his facility, was without symptoms, and had isolated at his facility. Noted to have INR 11 at facility 11/3, held coumadin at facility.  On 11/5, patient had fall overnight, was seen in the ER, noted to have INR 8, CT head negative.  Held coumadin and discharged back to SNF.  Back at SNF, patient was weak.  Bed bound to the point that he developed a new bedsore, per nephew.  Then today, he fell again and was confused and had decreased responsiveness so brought to ER.  In the ER, INR 2.3, Na 130, K 3.1, BUN to creatinine ratio up, appeared dehyrated, BP initially 83/48. CT head showed severe old encephalomalacia and also new right occipital SDH without mass effect.  This was discussed with Neurosurgery who recommended nonoperative management, repeat CT head in short interval.  [place] and vitamin K were given, as well as vancomycin/Zosyn for pleural effusion.       ROS: Review of Systems  Unable to perform ROS: Mental status change          Past Medical History:  Diagnosis Date   Arthritis    CHF (congestive heart failure) (HCC)    Coronary artery disease    Dementia (HCC)    VASCULAR   Dementia (HCC)    Diarrhea    SEVERAL DAYS/ GIVEN MED TO STOP AT HOME HE LIVES IN    Dysrhythmia    AFIB   GERD (gastroesophageal reflux disease)    Headache    MIGRAINE   Heart murmur    SURGICAL REPAIR   HOH (hard of hearing)    Hypercholesterolemia    Hypertension    Myocardial infarction ([place])    Neuromuscular disorder ([place])    MUSCLE WEAKNESS   Osteoporosis    Peripheral vascular disease ([place])    Presence of permanent cardiac pacemaker    Seizures ([place])    x1 years ago no longer on anti seizure medicines   Stroke ([place]) 1996   more weakness left side CEREBELLAR STROKE SYNDROME   Tunnel vision    Vision loss    bil tunnel vision    Past Surgical History:  Procedure Laterality Date   CARDIAC VALVE SURGERY     CATARACT EXTRACTION W/PHACO Left 12/28/2017   Procedure: CATARACT EXTRACTION PHACO AND INTRAOCULAR LENS PLACEMENT (IOC);  Surgeon: Birder Robson, MD;  Location: [facility];  Service: Ophthalmology;  Laterality: Left;  [place] 00:17.6 AP% 12.7 CDE 2.24 Fluid Pack LOt # [id number]   CATARACT EXTRACTION W/PHACO Right 01/25/2018   Procedure: CATARACT EXTRACTION PHACO AND INTRAOCULAR LENS PLACEMENT (IOC);  Surgeon: Birder Robson, MD;  Location: [facility];  Service: Ophthalmology;  Laterality: Right;  [place] 00:27.0 AP% 14.1 CDE 3.81 Fluid Pack lot # [id number]   HERNIA REPAIR     INSERT / REPLACE /  REMOVE PACEMAKER     LOWER EXTREMITY ANGIOGRAPHY Left 05/11/2017   Procedure: Lower Extremity Angiography;  Surgeon: Renford Dills, MD;  Location: [facility];  Service: Cardiovascular;  Laterality: Left;   LOWER EXTREMITY INTERVENTION  05/11/2017   Procedure: Lower Extremity Intervention;  Surgeon: Renford Dills, MD;  Location: [facility];  Service: Cardiovascular;;   MURMUR     HEART REPAIR   PERIPHERAL VASCULAR CATHETERIZATION Left 02/04/2016   Procedure: Lower Extremity Angiography;  Surgeon: Renford Dills, MD;  Location: [facility];  Service: Cardiovascular;  Laterality: Left;   PERIPHERAL VASCULAR  CATHETERIZATION  02/04/2016   Procedure: Lower Extremity Intervention;  Surgeon: Renford Dills, MD;  Location: [facility];  Service: Cardiovascular;;   VASCULAR SURGERY      Social History: Patient lives in SNF for >10 years.  The patient walks with a cane.  He is blind.  He is conversational but forgetful level of dementia.  Nonsmoker.  No Known Allergies  Family history: Unable to obtain due to patient mentation  Prior to Admission medications   Medication Sig Start Date End Date Taking? Authorizing Provider  acetaminophen (TYLENOL) 325 MG tablet Take 650 mg by mouth every 6 (six) hours as needed for mild pain.   Yes [provider]  aspirin EC 81 MG tablet Take 1 tablet (81 mg total) by mouth daily. 02/05/16  Yes Schnier, Latina Craver, MD  calcium gluconate 500 MG tablet Take 500 mg by mouth 2 (two) times daily.   Yes [provider]  donepezil (ARICEPT) 10 MG tablet Take 10 mg by mouth daily.    Yes [provider]  furosemide (LASIX) 20 MG tablet Take 20 mg by mouth daily.  12/24/16  Yes [provider]  HYDROcodone-acetaminophen (NORCO/VICODIN) 5-325 MG tablet Take 2 tablets by mouth every 6 (six) hours as needed for moderate pain.    Yes [provider]  hydroxypropyl methylcellulose / hypromellose (ISOPTO TEARS / GONIOVISC) 2.5 % ophthalmic solution Place 2 drops into both eyes daily as needed for dry eyes.   Yes [provider]  loperamide (IMODIUM A-D) 2 MG tablet Take 4 mg by mouth See admin instructions. Give 2 tablets after first loose stool and 1 tablet after each consecutive stool   Yes [provider]  memantine (NAMENDA) 10 MG tablet Take 10 mg by mouth 2 (two) times daily.    Yes [provider]  metolazone (ZAROXOLYN) 2.5 MG tablet Take 2.5 mg by mouth daily.   Yes [provider]  metoprolol tartrate (LOPRESSOR) 25 MG tablet Take 37.5 mg by mouth 2 (two) times daily.    Yes [provider]  Multiple Vitamin (MULTIVITAMIN WITH MINERALS) TABS tablet Take 1 tablet by mouth daily.   Yes [provider]  ramipril (ALTACE) 5 MG capsule Take 5 mg by mouth daily.    Yes [provider]  simvastatin (ZOCOR) 10 MG tablet Take 10 mg by mouth at bedtime.    Yes [provider]  tamsulosin (FLOMAX) 0.4 MG CAPS capsule Take 0.4 mg by mouth daily. 05/04/17  Yes [provider]  Vitamin D, Ergocalciferol, (DRISDOL) 50000 units CAPS capsule Take 50,000 Units by mouth every 30 (thirty) days. (take on the 7th of the month)   Yes [provider]       Physical Exam: BP (!) 162/140 (BP Location: Right Arm)    Pulse 64    Temp Marland Kitchen)  97.1 F (36.2 C) (Oral)    Resp 18    Ht 5\' 10"  (1.778 m)    Wt 79.4 kg    SpO2 100%    BMI 25.11 kg/m  General appearance: Thin undernourished appearing adult male, appears stuporous, no obvious distress.   Eyes: Anicteric, conjunctiva pink, lids have red hyperpigmentation and lashes normal. Pupils equal. ENT: No nasal deformity, discharge, epistaxis.  Hearing dimininshed. OP dry, crusting around lips and nose  Neck: No neck masses.  Trachea midline.  No thyromegaly/tenderness. Lymph: No cervical or supraclavicular lymphadenopathy. Skin: Warm and dry and tented.  No jaundice.  No suspicious rashes or lesions. Cardiac: Tachycardic, regular, nl S1-S2, no murmurs appreciated.  Capillary refill is brisk.  JVP normal.  No LE edema.  Radial pulses 2+ and symmetric. Respiratory: Shallow tachypneic, diminished thorughout, no rales or wheeizng.  Diminished on right   Abdomen: Abdomen soft.  No grimace to palpation, no swelling. No ascites, distension, hepatosplenomegaly.   MSK: No deformities or effusions of the large joints of the upper or lower extremities bilaterally.  No cyanosis or clubbing.  Diffuse severe loss of muscle mass Neuro: Face symmetric.  Tongue midline.  Blind.  Says only one word answers, no obvious  dysarthria, left arm weaker than left, both generally weak.       Psych: Responds to voice, but sluggish.  Attenion timinised.     Labs on Admission:  I have personally reviewed following labs and imaging studies: CBC: Recent Labs  Lab 08/03/19 0917 08/07/19 0931  WBC 5.8 7.3  NEUTROABS 4.8 6.2  HGB 13.8 12.4*  HCT 42.3 36.5*  MCV 79.1* 76.4*  PLT 146* 139*   Basic Metabolic Panel: Recent Labs  Lab 08/03/19 0917 08/07/19 0931  NA 132* 130*  K 3.3* 3.1*  CL 87* 85*  CO2 31 32  GLUCOSE 104* 111*  BUN 31* 32*  CREATININE 0.87 0.84  CALCIUM 9.2 9.0   GFR: Estimated Creatinine Clearance: 80.9 mL/min (by C-G formula based on SCr of 0.84 mg/dL).  Liver Function Tests: Recent Labs  Lab 08/03/19 0917 08/07/19 0931  AST 35 43*  ALT 18 21  ALKPHOS 93 91  BILITOT 1.5* 2.2*  PROT 7.1 7.5  ALBUMIN 3.5 3.5    Coagulation Profile: Recent Labs  Lab 08/03/19 0917 08/07/19 0931 08/07/19 1347  INR 8.1* 2.3* 1.4*    Lipid Profile: Recent Labs    08/07/19 0931  TRIG 88   Anemia Panel: Recent Labs    08/07/19 0931  FERRITIN 300        Radiological Exams on Admission: Personally reviewed CT head report.  CXR reviewed personally, showed right sided effusion.: Ct Head Wo Contrast  Result Date: 08/07/2019 CLINICAL DATA:  Weakness.  Altered mental status. EXAM: CT HEAD WITHOUT CONTRAST TECHNIQUE: Contiguous axial images were obtained from the base of the skull through the vertex without intravenous contrast. COMPARISON:  Head CT scan 08/03/2019. FINDINGS: Brain: Large, remote right temporo-occipital infarct is again seen. The patient has acute subdural hemorrhage over the right convexities, right tentorium and overlying encephalomalacia in the right occipital and temporal lobes. There is also some subdural hemorrhage along the posterior aspect of the falx on the right. No midline shift or hydrocephalus. No evidence of acute infarct. Vascular: Extensive atherosclerosis.   Otherwise negative. Skull: Intact.  No focal lesion. Sinuses/Orbits: Status post cataract surgery.  Otherwise negative. Other: None. IMPRESSION: This examination is positive for acute subdural hemorrhage over the right convexities. The largest volume  of hemorrhage overlies encephalomalacia in the right occipital and temporal lobes where the patient has a remote infarct. Negative for midline shift or hydrocephalus. Critical Value/emergent results were called by telephone at the time of interpretation on 08/07/2019 at 11:13 am to [facility] , who verbally acknowledged these results. Electronically Signed   By: Drusilla Kanner M.D.   On: 08/07/2019 11:17   Dg Chest Port 1 View  Result Date: 08/07/2019 CLINICAL DATA:  Weakness. EXAM: PORTABLE CHEST 1 VIEW COMPARISON:  Single-view of the chest 08/03/2019 FINDINGS: Hazy opacity of the right chest is consistent with a moderate to moderately large layering pleural effusion. There is some right basilar atelectasis. The left lung is clear. Heart size is enlarged. No pneumothorax. Pacing device is in place. IMPRESSION: Moderate to moderately large right pleural effusion with compressive atelectasis in the left lung base. Cardiomegaly without edema. Electronically Signed   By: Drusilla Kanner M.D.   On: 08/07/2019 09:35    EKG: Independently reviewed. Rate 66, old RBBB, Afib       Assessment/Plan    Subdural hematoma causing acute encephalopathy -Consult neurosurgery -Repeat CT head in 6 hours -PCC and vitamin K reversal -Hold warfarin -Trend INR -PT/OT eval -Consult palliative care -SLP consult   Right-sided pleural effusion -Obtain ultrasound-guided thoracentesis if able -Send fluid for culture, cytology, LDH, protein  Recent Covid infection The patient is recovered from recent Covid infection, he has no active infection.  Hyponatremia Hypokalemia Suspect this is hypovolemic hyponatremia -Supplement potassium -IV  fluids  Cerebrovascular disease Diastolic CHF, chronic Hypertension Blood pressure low -Hold Zaroxolyn, Lasix -Hold ramipril -Resume simvastatin to metoprolol unable to take p.o. -Hold aspirin   Chronic venous ulcers Do not appear infected  Stage I sacral pressure injury, present on arrival -WOC consult  Chronic atrial fibrillation with pacemaker -Hold warfarin -Resume metoprolol when able to take p.o.  Dementia -Resume donepezil and memantine when able to take p.o.  History of seizures Not on AEDs       DVT prophylaxis: SCDs  Code Status: DO ONT RESUSCITATE  Family Communication: Nephew/POA  Disposition Plan: Anticipate observation, IV fliuds for dehydration and PT eval.  Likely will be several days before his outcome is clear. Consults called: Neurosurgery Admission status: INAPTIENT     Medical decision making: Patient seen at 4:52 PM on 08/07/2019.  The patient was discussed with Dr. Mayford Knife.  What exists of the patient's chart was reviewed in depth and summarized above.  Clinical condition: stable.        Earl Lites Catharine Kettlewell [facility] Pager: please page via [email or web address]  Contact charge nurse if [employer] needed

## 2019-08-08 ENCOUNTER — Inpatient Hospital Stay: Payer: Medicare Other

## 2019-08-08 DIAGNOSIS — L8990 Pressure ulcer of unspecified site, unspecified stage: Secondary | ICD-10-CM | POA: Insufficient documentation

## 2019-08-08 LAB — MRSA PCR SCREENING: MRSA by PCR: NEGATIVE

## 2019-08-08 LAB — HEPATIC FUNCTION PANEL
ALT: 24 U/L (ref 0–44)
AST: 71 U/L — ABNORMAL HIGH (ref 15–41)
Albumin: 3.2 g/dL — ABNORMAL LOW (ref 3.5–5.0)
Alkaline Phosphatase: 80 U/L (ref 38–126)
Bilirubin, Direct: 0.5 mg/dL — ABNORMAL HIGH (ref 0.0–0.2)
Indirect Bilirubin: 1.2 mg/dL — ABNORMAL HIGH (ref 0.3–0.9)
Total Bilirubin: 1.7 mg/dL — ABNORMAL HIGH (ref 0.3–1.2)
Total Protein: 6.5 g/dL (ref 6.5–8.1)

## 2019-08-08 LAB — PROTIME-INR
INR: 1.3 — ABNORMAL HIGH (ref 0.8–1.2)
INR: 1.2 (ref 0.8–1.2)
INR: 1.2 (ref 0.8–1.2)
INR: 1.2 (ref 0.8–1.2)
INR: 1.2 (ref 0.8–1.2)
Prothrombin Time: 15.9 seconds — ABNORMAL HIGH (ref 11.4–15.2)
Prothrombin Time: 15.2 seconds (ref 11.4–15.2)
Prothrombin Time: 14.9 seconds (ref 11.4–15.2)
Prothrombin Time: 15.5 seconds — ABNORMAL HIGH (ref 11.4–15.2)
Prothrombin Time: 14.8 seconds (ref 11.4–15.2)

## 2019-08-08 LAB — BASIC METABOLIC PANEL
Anion gap: 9 (ref 5–15)
BUN: 24 mg/dL — ABNORMAL HIGH (ref 8–23)
CO2: 26 mmol/L (ref 22–32)
Calcium: 8.1 mg/dL — ABNORMAL LOW (ref 8.9–10.3)
Chloride: 100 mmol/L (ref 98–111)
Creatinine, Ser: 0.67 mg/dL (ref 0.61–1.24)
GFR calc Af Amer: >60 mL/min (ref >60)
GFR calc non Af Amer: >60 mL/min (ref >60)
Glucose, Bld: 137 mg/dL — ABNORMAL HIGH (ref 70–99)
Potassium: 3.5 mmol/L (ref 3.5–5.1)
Sodium: 135 mmol/L (ref 135–145)

## 2019-08-08 LAB — CBC
HCT: 32.5 % — ABNORMAL LOW (ref 39.0–52.0)
Hemoglobin: 10.8 g/dL — ABNORMAL LOW (ref 13.0–17.0)
MCH: 25.8 pg — ABNORMAL LOW (ref 26.0–34.0)
MCHC: 33.2 g/dL (ref 30.0–36.0)
MCV: 77.6 fL — ABNORMAL LOW (ref 80.0–100.0)
Platelets: 165 10*3/uL (ref 150–400)
RBC: 4.19 MIL/uL — ABNORMAL LOW (ref 4.22–5.81)
RDW: 15.3 % (ref 11.5–15.5)
WBC: 6.2 10*3/uL (ref 4.0–10.5)
nRBC: 0 % (ref 0.0–0.2)

## 2019-08-08 LAB — URINALYSIS, COMPLETE (UACMP) WITH MICROSCOPIC
Bacteria, UA: NONE SEEN
Bilirubin Urine: NEGATIVE
Glucose, UA: NEGATIVE mg/dL
Ketones, ur: NEGATIVE mg/dL
Leukocytes,Ua: NEGATIVE
Nitrite: NEGATIVE
Protein, ur: 30 mg/dL — AB
Specific Gravity, Urine: 1.015 (ref 1.005–1.030)
pH: 6 (ref 5.0–8.0)

## 2019-08-08 LAB — LACTATE DEHYDROGENASE: LDH: 205 U/L — ABNORMAL HIGH (ref 98–192)

## 2019-08-08 MED ORDER — METOPROLOL TARTRATE 5 MG/5ML IV SOLN: 5.0000 mg | Freq: Four times a day (QID) | INTRAVENOUS | Status: DC | PRN

## 2019-08-08 NOTE — ED Notes (Signed)
Patient transported to Ultrasound 

## 2019-08-08 NOTE — Progress Notes (Addendum)
no uop since admission at 1030 bladdler scan showed 663ml  Notified MD danford. Verbal for bladder scan q shift or PRN I and O cath for 576ml or greater. Pt has incontinence episode after bladder scan

## 2019-08-08 NOTE — Progress Notes (Signed)
NP notified of 4 beat vtach run. Pt also has a tremor (unsure of the age). NP notified. VSS. Pt responsive and following commands. Will continue to monitor.

## 2019-08-08 NOTE — Progress Notes (Signed)
PMT consult received and chart reviewed. Patient assessment completed. No family at bedside. Briefly spoke with nephew, Mitzi Hansen via telephone. We plan to meet tomorrow morning, 08/09/19 at 9am to discuss goals of care. Thank you.  NO CHARGE  Ihor Dow, [place], FNP-C Palliative Medicine Team  Phone: [phone] Fax: [phone]

## 2019-08-08 NOTE — Evaluation (Signed)
Clinical/Bedside Swallow Evaluation Patient Details  Name: John Rodgers MRN: [id number] Date of Birth: 1946-07-01  Today's Date: 08/08/2019 Time: SLP Start Time (ACUTE ONLY): 15 SLP Stop Time (ACUTE ONLY): 1318 SLP Time Calculation (min) (ACUTE ONLY): 58 min  Past Medical History:  Past Medical History:  Diagnosis Date  . Arthritis   . CHF (congestive heart failure) ([place])   . Coronary artery disease   . Dementia ([place])    VASCULAR  . Dementia ([place])   . Diarrhea    SEVERAL DAYS/ GIVEN MED TO STOP AT HOME HE LIVES IN  . Dysrhythmia    AFIB  . GERD (gastroesophageal reflux disease)   . Headache    MIGRAINE  . Heart murmur    SURGICAL REPAIR  . HOH (hard of hearing)   . Hypercholesterolemia   . Hypertension   . Myocardial infarction ([place])   . Neuromuscular disorder ([place])    MUSCLE WEAKNESS  . Osteoporosis   . Peripheral vascular disease ([place])   . Presence of permanent cardiac pacemaker   . Seizures ([place])    x1 years ago no longer on anti seizure medicines  . Stroke [facility]) 1996   more weakness left side CEREBELLAR STROKE SYNDROME  . Tunnel vision   . Vision loss    bil tunnel vision   Past Surgical History:  Past Surgical History:  Procedure Laterality Date  . CARDIAC VALVE SURGERY    . CATARACT EXTRACTION W/PHACO Left 12/28/2017   Procedure: CATARACT EXTRACTION PHACO AND INTRAOCULAR LENS PLACEMENT (IOC);  Surgeon: Birder Robson, MD;  Location: [facility] ORS;  Service: Ophthalmology;  Laterality: Left;  [place] 00:17.6 AP% 12.7 CDE 2.24 Fluid Pack LOt # [id number]  . CATARACT EXTRACTION W/PHACO Right 01/25/2018   Procedure: CATARACT EXTRACTION PHACO AND INTRAOCULAR LENS PLACEMENT (IOC);  Surgeon: Birder Robson, MD;  Location: [facility] OR[facility];  Service: Ophthalmology;  Laterality: Right;  [place] 00:27.0 AP% 14.1 CDE 3.81 Fluid Pack lot # [id number]  . HERNIA REPAIR    . INSERT / REPLACE / REMOVE PACEMAKER    . LOWER EXTREMITY ANGIOGRAPHY Left 05/11/2017   Procedure: Lower Extremity  Angiography;  Surgeon: Katha Cabal, MD;  Location: [place] CV LAB;  Service: Cardiovascular;  Laterality: Left;  . LOWER EXTREMITY INTERVENTION  05/11/2017   Procedure: Lower Extremity Intervention;  Surgeon: Katha Cabal, MD;  Location: [place] CV LAB;  Service: Cardiovascular;;  . MURMUR     HEART REPAIR  . PERIPHERAL VASCULAR CATHETERIZATION Left 02/04/2016   Procedure: Lower Extremity Angiography;  Surgeon: Katha Cabal, MD;  Location: [place] CV LAB;  Service: Cardiovascular;  Laterality: Left;  . PERIPHERAL VASCULAR CATHETERIZATION  02/04/2016   Procedure: Lower Extremity Intervention;  Surgeon: Katha Cabal, MD;  Location: [place] CV LAB;  Service: Cardiovascular;;  . VASCULAR SURGERY     HPI: Per admitting history and Physical:    John Rodgers is a 73 y.o. M with hx dementia, SNF swelling, CVA left-sided weakness, blindness, dCHF, atrial fibrillation on warfarin pacemaker in place, CAD, HTN, history of seizures not on AEDs, and history of venous insufficiency ulcers who presented with generalized weakness, hypoxia, decreased sensorium, possibly over days.  Assessment / Plan / Recommendation Clinical Impression  Pt presents with moderate dysphagia demonstrating over s/s of aspiration with thin and nectar thick liquids. Pt was somewhat lethargic and also had difficulty following directions of a thorough oral mech exam. Noted dried secretions on lips and tongue. Pt tolerated several small bites of applesauce with  no s/s of aspiration given time to propel and swallow each bite. Mild oral residue after the swallow. Immediate coughing noted after one tsp of thin water and delayed coughing after a few sips of nectar thick. DNT solids. Rec diet downgrade to dysphagia 1 with honey thick by TSP. If Pt becomes more alert consistently, will reassess for a less restrictive diet. Nsg. Should hold trays until Pt is more alert to decrease risk of aspiration. Pt was  difficult to position and needed pillows to support his leaning to the left.  SLP Visit Diagnosis: Dysphagia, oropharyngeal phase (R13.12)    Aspiration Risk  Moderate aspiration risk;Severe aspiration risk    Diet Recommendation Dysphagia 1 (Puree)   Liquid Administration via: (honey thick by TSP) Medication Administration: Crushed with puree Supervision: Full supervision/cueing for compensatory strategies;Staff to assist with self feeding Compensations: Slow rate;Small sips/bites Postural Changes: Seated upright at 90 degrees(May need to add pillow behind left side)    Other  Recommendations Oral Care Recommendations: Oral care QID;Oral care before and after PO   Follow up Recommendations Skilled Nursing facility      Frequency and Duration min 2x/week  1 week       Prognosis Prognosis for Safe Diet Advancement: Guarded Barriers to Reach Goals: Cognitive deficits;Severity of deficits      Swallow Study   General Type of Study: Bedside Swallow Evaluation Diet Prior to this Study: Regular Respiratory Status: Room air History of Recent Intubation: No Behavior/Cognition: Agitated;Lethargic/Drowsy;Requires cueing Oral Cavity Assessment: Dried secretions;Dry Oral Cavity - Dentition: Missing dentition;Poor condition Vision: Impaired for self-feeding Self-Feeding Abilities: Total assist Patient Positioning: Upright in bed Baseline Vocal Quality: Low vocal intensity Volitional Cough: Cognitively unable to elicit    Oral/Motor/Sensory Function Overall Oral Motor/Sensory Function: Moderate impairment   Ice Chips Ice chips: Impaired Presentation: Spoon Oral Phase Impairments: Reduced labial seal;Reduced lingual movement/coordination;Poor awareness of bolus Oral Phase Functional Implications: Prolonged oral transit Pharyngeal Phase Impairments: Cough - Delayed   Thin Liquid Thin Liquid: Impaired Presentation: Spoon Oral Phase Impairments: Reduced lingual  movement/coordination;Poor awareness of bolus Oral Phase Functional Implications: Prolonged oral transit;Oral holding Pharyngeal  Phase Impairments: Cough - Delayed    Nectar Thick Nectar Thick Liquid: Impaired Oral Phase Impairments: Reduced lingual movement/coordination;Poor awareness of bolus;Reduced labial seal Oral phase functional implications: Prolonged oral transit Pharyngeal Phase Impairments: Cough - Delayed   Honey Thick     Puree Puree: Impaired Oral Phase Impairments: Reduced lingual movement/coordination Oral Phase Functional Implications: Prolonged oral transit;Oral residue   Solid     Solid: Not tested      Eather Colas 08/08/2019,1:19 PM

## 2019-08-08 NOTE — Progress Notes (Signed)
PROGRESS NOTE    John Rodgers  ZOX:[id number] DOB: Dec 21, 1945 DOA: 08/07/2019 PCP: Juluis Pitch, MD      Brief Narrative:  Mr. Betty is a 73 y.o. M with hx dementia, SNF swelling, blind, hard of hearing, CVA left-sided weakness, dCHF, atrial fibrillation on warfarin pacemaker in place, CAD, HTN, history of seizures not on AEDs, and history of venous insufficiency ulcers who presented with generalized weakness, hypoxia, decreased sensorium, possibly over days.  10/19 patient tested positive for COVID, only symptom weakness 11/3 INR 11 at facility, patient noted to be weak, warfarin held 11/5 patient had a fall, seen in ER, CT head NAICP, discharged back to SNF off warfarin 11/9 patient sent back from SNF due to another fall, still weak, now decreased sensorium, ?hypoxia  In the ER, INR 2.3, Na 130, K 3.1, BUN to creatinine ratio up, appeared dehyrated, BP initially 83/48. CT head showed severe old encephalomalacia and also new right occipital SDH without mass effect.  This was discussed with Neurosurgery who recommended nonoperative management, repeat CT head in short interval.  [place] and vitamin K were given, as well as vancomycin/Zosyn for pleural effusion.       Assessment & Plan:  Subdural hematoma causing acute encephalopathy CT head at admission showed acute RIGHT subdural hemorrhage (temporo-occipital, right tetorium, right frontal) without midline shift.    Neurosurgery consulted, reocmmended repeat imaging, no intervention.  Repeat CT head at 6 hours showed unchanged hemorrhage.  INR reversed in ER.  Today INR 1.2.  Mentation appears worse however. -Consult neurosurgery -Hold warfarin -Trend INR -PT/OT eval -Consult palliative care -SLP consult   Right-sided pleural effusion This is moderate to large in size.    However, he is not hypoxic, tachypneic, or in respiratory distress.  Given his worsening mentation, I will defer thoracentesis for now.  We can  reconsider thora if his mentation improves.  Recent Covid infection The patient is recovered from recent Covid infection, he has no active infection.  Hyponatremia Hypokalemia Suspect this is hypovolemic hyponatremia.  Na normalized with fluids.  K resolved. -Continue IV fluids until able to take PO  Cerebrovascular disease Diastolic CHF, chronic Hypertension BP normal -Hold Zaroxolyn, Lasix -Hold ramipril until BP normalizes -Resume simvastatin to metoprolol unable to take p.o. -Hold aspirin   Chronic venous ulcers Do not appear infected  Stage II sacral pressure injury, present on arrival -WOC consult  Chronic atrial fibrillation with pacemaker -Hold warfarin -Resume oral metoprolol when able to take p.o. -PRN metoprolol IV for HR >120  Dementia -Resume donepezil and memantine when able to take p.o.  History of seizures Not on AEDs   Anemia Some degree of dilution.  Otherwise, cause unknown. -Trend CBC  Elevated troponin Demand ischemia only.   ELevated bilirubin Minimal elevation, abdominal exam benign.      MDM and disposition: The below labs and imaging reports reviewed and summarized above.  Medication management as above.   The patient was admitted with a subdural hematoma.  He has severely depressed mentation compounded by baseline blindness and HOH. His subdural would not benefit from operative approach and only supportive cares are possible.  His prognosis is very guarded, we have asked for palliative care to provide assistance.  We will continue IV fluids for now, but if his mentation remains poor, he is unable to engage with therapy, or take any food by mouth, suspected we may need to discuss end-of-life care with family.        DVT prophylaxis: SCDs Code  Status: DO NOT RESUSCITATE Family Communication: Nephew/POA    Consultants:   Neurosurgery  Procedures:   11/9 CT head -- subdural, old  encephalomalacia  Antimicrobials:   None    Subjective: Per nursing, no respiratory distress.  No pain apparent.  No improvement in mentation.  Only opens eyes occasionally.  Unable to make needs known.  Objective: Vitals:   08/08/19 1038 08/08/19 1146 08/08/19 1445 08/08/19 1500  BP: 135/81 116/70  112/72  Pulse: 91 96  71  Resp: 19     Temp: 97.7 F (36.5 C) 97.7 F (36.5 C)  97.9 F (36.6 C)  TempSrc: Oral Oral  Axillary  SpO2: 97% 96% 90% 90%  Weight:      Height:        Intake/Output Summary (Last 24 hours) at 08/08/2019 1852 Last data filed at 08/08/2019 1600 Gross per 24 hour  Intake 2571.33 ml  Output 0 ml  Net 2571.33 ml   Filed Weights   08/07/19 0916  Weight: 79.4 kg    Examination: General appearance: elderly thin adult male, appears obtunded, opens eyes to nephew's voice, says "yes", but not consistently answer questions  HEENT: No nasal deformity, discharge, epistaxis.  Lips dry, OP very dry, no oral lesions, hearing diminished.   Skin: Warm and dry and tented. Cardiac: Tachycardic, regular, no murmurs, no LE edema. Respiratory: Normal respiratory rate and rhythm.  CTAB without rales or wheezes. Abdomen: Abdomen soft.  No grimace to palpation, no rigidity   MSK: No deformities or effusions.  Diffuse loss of subcutaneous muscle mass and fat Neuro: Obtunded.  Left arm weak 3/5, right arm seems 4/5, does not move legs, only intermittently follows commands.  Does not open eyes. Psych: Unable to assess.    Data Reviewed: I have personally reviewed following labs and imaging studies:  CBC: Recent Labs  Lab 08/03/19 0917 08/07/19 0931 08/08/19 0605  WBC 5.8 7.3 6.2  NEUTROABS 4.8 6.2  --   HGB 13.8 12.4* 10.8*  HCT 42.3 36.5* 32.5*  MCV 79.1* 76.4* 77.6*  PLT 146* 139* 165   Basic Metabolic Panel: Recent Labs  Lab 08/03/19 0917 08/07/19 0931 08/08/19 0605  NA 132* 130* 135  K 3.3* 3.1* 3.5  CL 87* 85* 100  CO2 31 32 26  GLUCOSE 104*  111* 137*  BUN 31* 32* 24*  CREATININE 0.87 0.84 0.67  CALCIUM 9.2 9.0 8.1*   GFR: Estimated Creatinine Clearance: 84.9 mL/min (by C-G formula based on SCr of 0.67 mg/dL). Liver Function Tests: Recent Labs  Lab 08/03/19 0917 08/07/19 0931 08/08/19 0605  AST 35 43* 71*  ALT 18 21 24   ALKPHOS 93 91 80  BILITOT 1.5* 2.2* 1.7*  PROT 7.1 7.5 6.5  ALBUMIN 3.5 3.5 3.2*   No results for input(s): LIPASE, AMYLASE in the last 168 hours. No results for input(s): AMMONIA in the last 168 hours. Coagulation Profile: Recent Labs  Lab 08/08/19 0038 08/08/19 0605 08/08/19 1042 08/08/19 1421 08/08/19 1755  INR 1.3* 1.2 1.2 1.2 1.2   Cardiac Enzymes: No results for input(s): CKTOTAL, CKMB, CKMBINDEX, TROPONINI in the last 168 hours. BNP (last 3 results) No results for input(s): PROBNP in the last 8760 hours. HbA1C: No results for input(s): HGBA1C in the last 72 hours. CBG: No results for input(s): GLUCAP in the last 168 hours. Lipid Profile: Recent Labs    08/07/19 0931  TRIG 88   Thyroid Function Tests: No results for input(s): TSH, T4TOTAL, FREET4, T3FREE, THYROIDAB in the  last 72 hours. Anemia Panel: Recent Labs    08/07/19 0931  FERRITIN 300   Urine analysis:    Component Value Date/Time   COLORURINE YELLOW (A) 08/08/2019 0509   APPEARANCEUR CLEAR (A) 08/08/2019 0509   LABSPEC 1.015 08/08/2019 0509   PHURINE 6.0 08/08/2019 0509   GLUCOSEU NEGATIVE 08/08/2019 0509   HGBUR SMALL (A) 08/08/2019 0509   BILIRUBINUR NEGATIVE 08/08/2019 0509   KETONESUR NEGATIVE 08/08/2019 0509   PROTEINUR 30 (A) 08/08/2019 0509   NITRITE NEGATIVE 08/08/2019 0509   LEUKOCYTESUR NEGATIVE 08/08/2019 0509   Sepsis Labs: (procalcitonin:4,lacticacidven:4)  ) Recent Results (from the past 240 hour(s))  Blood Culture (routine x 2)     Status: None (Preliminary result)   Collection Time: 08/07/19  9:29 AM   Specimen: BLOOD  Result Value Ref Range Status   Specimen Description  BLOOD LEFT ANTECUBITAL  Final   Special Requests   Final    BOTTLES DRAWN AEROBIC AND ANAEROBIC Blood Culture adequate volume   Culture   Final    NO GROWTH 1 DAY Performed at [facility], [address], [place], [place] [address]    Report Status PENDING  Incomplete  Blood Culture (routine x 2)     Status: None (Preliminary result)   Collection Time: 08/07/19  9:33 AM   Specimen: BLOOD  Result Value Ref Range Status   Specimen Description BLOOD BLOOD LEFT FOREARM  Final   Special Requests   Final    BOTTLES DRAWN AEROBIC AND ANAEROBIC Blood Culture adequate volume   Culture   Final    NO GROWTH 1 DAY Performed at [facility], [address], [place], [place] [address]    Report Status PENDING  Incomplete  MRSA PCR Screening     Status: None   Collection Time: 08/08/19  4:47 PM   Specimen: Nasal Mucosa; Nasopharyngeal  Result Value Ref Range Status   MRSA by PCR NEGATIVE NEGATIVE Final    Comment:        The GeneXpert MRSA Assay (FDA approved for NASAL specimens only), is one component of a comprehensive MRSA colonization surveillance program. It is not intended to diagnose MRSA infection nor to guide or monitor treatment for MRSA infections. Performed at [facility], [address], [place], [place] [address]            Radiology Studies: Ct Head Wo Contrast  Result Date: 08/07/2019 CLINICAL DATA:  Follow-up of subdural hemorrhage EXAM: CT HEAD WITHOUT CONTRAST TECHNIQUE: Contiguous axial images were obtained from the base of the skull through the vertex without intravenous contrast. COMPARISON:  August 07, 2019 at 10:34 a.m. FINDINGS: Brain: Again noted is a large area of encephalomalacia involving the right parietooccipital lobe and left occipital lobe. The acute subdural hemorrhage over the posterior falx and overlying the entire right convexity is unchanged in size and appearance from the prior exam. This is most notable is along  the posterior falx and right occipital lobe. This measures approximately 6 mm in transverse dimension over the posterior falx and a maximum diameter over the posterior right occipital lobe of approximately 6.5 mm. No new extra-axial collections are seen. No midline shift is noted. Again noted is ex vacuo dilatation of the posterior horn of the right lateral ventricle. The ventricles are otherwise stable in appearance. Vascular: No hyperdense vessel or unexpected calcification. Again noted is dense internal carotid artery calcifications. Skull: The skull is intact. No fracture or focal lesion identified. Sinuses/Orbits: The visualized paranasal  sinuses and mastoid air cells are clear. The orbits and globes intact. Other: None IMPRESSION: 1. Subdural hemorrhage overlying the entire right convexity and posterior falx is unchanged in appearance and size from the prior exam. No new hemorrhage. 2. Stable areas of encephalomalacia involving the right parietooccipital lobe and left occipital lobe. Electronically Signed   By: Jonna ClarkBindu  Avutu M.D.   On: 08/07/2019 19:26   Ct Head Wo Contrast  Result Date: 08/07/2019 CLINICAL DATA:  Weakness.  Altered mental status. EXAM: CT HEAD WITHOUT CONTRAST TECHNIQUE: Contiguous axial images were obtained from the base of the skull through the vertex without intravenous contrast. COMPARISON:  Head CT scan 08/03/2019. FINDINGS: Brain: Large, remote right temporo-occipital infarct is again seen. The patient has acute subdural hemorrhage over the right convexities, right tentorium and overlying encephalomalacia in the right occipital and temporal lobes. There is also some subdural hemorrhage along the posterior aspect of the falx on the right. No midline shift or hydrocephalus. No evidence of acute infarct. Vascular: Extensive atherosclerosis.  Otherwise negative. Skull: Intact.  No focal lesion. Sinuses/Orbits: Status post cataract surgery.  Otherwise negative. Other: None. IMPRESSION:  This examination is positive for acute subdural hemorrhage over the right convexities. The largest volume of hemorrhage overlies encephalomalacia in the right occipital and temporal lobes where the patient has a remote infarct. Negative for midline shift or hydrocephalus. Critical Value/emergent results were called by telephone at the time of interpretation on 08/07/2019 at 11:13 am to [facility]ONATHAN WILLIAMS , who verbally acknowledged these results. Electronically Signed   By: Drusilla Kannerhomas  Dalessio M.D.   On: 08/07/2019 11:17   Dg Chest Port 1 View  Result Date: 08/07/2019 CLINICAL DATA:  Weakness. EXAM: PORTABLE CHEST 1 VIEW COMPARISON:  Single-view of the chest 08/03/2019 FINDINGS: Hazy opacity of the right chest is consistent with a moderate to moderately large layering pleural effusion. There is some right basilar atelectasis. The left lung is clear. Heart size is enlarged. No pneumothorax. Pacing device is in place. IMPRESSION: Moderate to moderately large right pleural effusion with compressive atelectasis in the left lung base. Cardiomegaly without edema. Electronically Signed   By: Drusilla Kannerhomas  Dalessio M.D.   On: 08/07/2019 09:35        Scheduled Meds:  naLOXone (NARCAN)  injection  0.4 mg Intravenous Once   Continuous Infusions:  dextrose 5 % and 0.9 % NaCl with KCl 40 mEq/L 125 mL/hr at 08/08/19 1047     LOS: 1 day    Time spent: 35 minutes    Alberteen Samhristopher P Sixto Bowdish, MD [facility] 08/08/2019, 6:52 PM     Please page through AMION:  [email or web address] Password [email or web address] If 7PM-7AM, please contact night-coverage

## 2019-08-08 NOTE — Progress Notes (Signed)
MEDICATION RELATED MONITORING NOTE   Pharmacy Monitoring post-Kcentra Administration Indication: Subdural hematoma in patient on warfarin  Vital Signs: Temp: 99.2 F (37.3 C) (11/10 0435) Temp Source: Oral (11/10 0435) BP: 120/69 (11/10 0846) Pulse Rate: 102 (11/10 0846)  Labs: Recent Labs    08/07/19 0931 08/08/19 0605  WBC 7.3 6.2  HGB 12.4* 10.8*  HCT 36.5* 32.5*  PLT 139* 165  CREATININE 0.84 0.67  ALBUMIN 3.5 3.2*  PROT 7.5 6.5  AST 43* 71*  ALT 21 24  ALKPHOS 91 80  BILITOT 2.2* 1.7*  BILIDIR  --  0.5*  IBILI  --  1.2*   Estimated Creatinine Clearance: 84.9 mL/min (by C-G formula based on SCr of 0.67 mg/dL).  Medical History: Past Medical History:  Diagnosis Date  . Arthritis   . CHF (congestive heart failure) ([place])   . Coronary artery disease   . Dementia ([place])    VASCULAR  . Dementia ([place])   . Diarrhea    SEVERAL DAYS/ GIVEN MED TO STOP AT HOME HE LIVES IN  . Dysrhythmia    AFIB  . GERD (gastroesophageal reflux disease)   . Headache    MIGRAINE  . Heart murmur    SURGICAL REPAIR  . HOH (hard of hearing)   . Hypercholesterolemia   . Hypertension   . Myocardial infarction ([place])   . Neuromuscular disorder ([place])    MUSCLE WEAKNESS  . Osteoporosis   . Peripheral vascular disease ([place])   . Presence of permanent cardiac pacemaker   . Seizures ([place])    x1 years ago no longer on anti seizure medicines  . Stroke [facility]) 1996   more weakness left side CEREBELLAR STROKE SYNDROME  . Tunnel vision   . Vision loss    bil tunnel vision    Medications:  Warfarin for atrial fibrillation (held since 11/3)  Assessment: Patient is a 73 y/o M with hx including dementia, CVA, blindness, atrial fibrillation on warfarin and with pacemaker, CAD, hx of seizures not on AEDs who presented to [facility] from facility with generalized weakness, hypoxia, decreased sensorium. Patient with recent supratherapeutic INR to 11 at facility on 11/3 and warfarin was held. Patient  sustained fall on 11/5 and was seen in ED and found to have INR of 8.1. Warfarin held and patient discharged back to SNF. Returned to ED 11/9 for aforementioned reasons and found to have subdural hematoma and was treated with Kcentra and vitamin K.    11/9 INR 2.3  11/9 13:47 INR 1.4 (post-Kcentra) 11/10 00:38 INR 1.3 11/10 06:05 INR 1.2   Plan:  -INR q6h x 4 then daily x 2 per post-Kcentra monitoring protocol  Paulina Fusi, PharmD, BCPS 08/08/2019 9:56 AM

## 2019-08-08 NOTE — ED Notes (Signed)
Floor unable to accept floor at this time due to patient not being re swabbed for COVID - 19.

## 2019-08-08 NOTE — ED Notes (Signed)
Attempted to call report.  No answer on floor.

## 2019-08-08 NOTE — Progress Notes (Signed)
Pt pulling tele monitor off and pulling at IV. IV covered and applied mitts

## 2019-08-08 NOTE — Consult Note (Signed)
Neurosurgery-New Consultation Evaluation 08/08/2019 John Rodgers [id number]  Identifying Statement: John Rodgers is a 73 y.o. male from [place] [address] with subdural hematoma  Physician Requesting Consultation: [facility]  History of Present Illness: John Rodgers is here with a history of previous stroke with encephalomalacia and dementia.  He additionally is on Coumadin for history of cardiac disease and previous stroke.  He reportedly had a fall earlier today but he does not recall that to me.  He was noted to be sleepy on arrival but would awaken in the emergency department.  He does not state that he has any pain currently.  His INR on arrival was above 2 and a CT scan of the head did show concern for new hemorrhage in the right occipital area along the area of his previous stroke.  Past Medical History:  Past Medical History:  Diagnosis Date  . Arthritis   . CHF (congestive heart failure) ([place])   . Coronary artery disease   . Dementia ([place])    VASCULAR  . Dementia ([place])   . Diarrhea    SEVERAL DAYS/ GIVEN MED TO STOP AT HOME HE LIVES IN  . Dysrhythmia    AFIB  . GERD (gastroesophageal reflux disease)   . Headache    MIGRAINE  . Heart murmur    SURGICAL REPAIR  . HOH (hard of hearing)   . Hypercholesterolemia   . Hypertension   . Myocardial infarction ([place])   . Neuromuscular disorder ([place])    MUSCLE WEAKNESS  . Osteoporosis   . Peripheral vascular disease ([place])   . Presence of permanent cardiac pacemaker   . Seizures ([place])    x1 years ago no longer on anti seizure medicines  . Stroke [facility]) 1996   more weakness left side CEREBELLAR STROKE SYNDROME  . Tunnel vision   . Vision loss    bil tunnel vision    Social History: Social History   Socioeconomic History  . Marital status: Single    Spouse name: Not on file  . Number of children: Not on file  . Years of education: Not on file  . Highest education level: Not on file  Occupational  History  . Not on file  Social Needs  . Financial resource strain: Not on file  . Food insecurity    Worry: Not on file    Inability: Not on file  . Transportation needs    Medical: Not on file    Non-medical: Not on file  Tobacco Use  . Smoking status: Former Research scientist (life sciences)  . Smokeless tobacco: Never Used  Substance and Sexual Activity  . Alcohol use: No  . Drug use: No  . Sexual activity: Not on file  Lifestyle  . Physical activity    Days per week: Not on file    Minutes per session: Not on file  . Stress: Not on file  Other Topics Concern  . Not on file  Social History Narrative  . Not on file    Family History: Family History  Family history unknown: Yes    Review of Systems:  Review of Systems - General ROS: Negative Psychological ROS: Negative Ophthalmic ROS: Negative ENT ROS: Negative Hematological and Lymphatic ROS: Negative  Endocrine ROS: Negative Respiratory ROS: Negative Cardiovascular ROS: Negative Gastrointestinal ROS: Negative Genito-Urinary ROS: Negative Musculoskeletal ROS: Negative Neurological ROS: Positive for altered mental status Dermatological ROS: Negative  Physical Exam: BP (!) 107/55 (BP Location: Right Arm)   Pulse 82   Temp  99.2 F (37.3 C) (Oral)   Resp 18   Ht 5\' 10"  (1.778 m)   Wt 79.4 kg   SpO2 97%   BMI 25.11 kg/m  Body mass index is 25.11 kg/m. Body surface area is 1.98 meters squared. General appearance: Alert, cooperative, in no acute distress Head: Normocephalic Eyes: Normal, EOM intact but with roving eye movements Oropharynx: Not wearing facemask Neck: Supple Ext: No edema in LE bilaterally, warm extremities  Neurologic exam:  Mental status: alertness: alert, orientation: Did not know his location or month.  Affect: Blunted Speech: fluent with mild dysarthria and unable to name objects consistently on exam Cranial nerves:  II: Unable to test visual fields given cooperation III/IV/VI: extra-ocular motions intact  bilaterally V/VII:no evidence of facial droop or weakness  VIII: hearing normal Motor: Strength appears 5 out of 5 in the left upper extremity, right upper extremity does move on command but he is unable to participate in formal strength testing with the arm.  Wiggles toes bilaterally Gait: Not tested  Laboratory: Results for orders placed or performed during the hospital encounter of 08/07/19  Blood Culture (routine x 2)   Specimen: BLOOD  Result Value Ref Range   Specimen Description BLOOD LEFT ANTECUBITAL    Special Requests      BOTTLES DRAWN AEROBIC AND ANAEROBIC Blood Culture adequate volume   Culture      NO GROWTH < 24 HOURS Performed at [facility], [address] [place], [place]    Report Status PENDING   Blood Culture (routine x 2)   Specimen: BLOOD  Result Value Ref Range   Specimen Description BLOOD BLOOD LEFT FOREARM    Special Requests      BOTTLES DRAWN AEROBIC AND ANAEROBIC Blood Culture adequate volume   Culture      NO GROWTH < 24 HOURS Performed at [facility], [address] [place], [place]    Report Status PENDING   Lactic acid, plasma  Result Value Ref Range   Lactic Acid, Venous 1.5 0.5 - 1.9 mmol/L  Lactic acid, plasma  Result Value Ref Range   Lactic Acid, Venous 1.3 0.5 - 1.9 mmol/L  CBC WITH DIFFERENTIAL  Result Value Ref Range   WBC 7.3 4.0 - 10.5 K/uL   RBC 4.78 4.22 - 5.81 MIL/uL   Hemoglobin 12.4 (L) 13.0 - 17.0 g/dL   HCT 49[address] (L) 15.0 - 56.9 %   MCV 76.4 (L) 80.0 - 100.0 fL   MCH 25.9 (L) 26.0 - 34.0 pg   MCHC 34.0 30.0 - 36.0 g/dL   RDW 79.4 80.1 - 65.5 %   Platelets 139 (L) 150 - 400 K/uL   nRBC 0.0 0.0 - 0.2 %   Neutrophils Relative % 86 %   Neutro Abs 6.2 1.7 - 7.7 K/uL   Lymphocytes Relative 6 %   Lymphs Abs 0.5 (L) 0.7 - 4.0 K/uL   Monocytes Relative 7 %   Monocytes Absolute 0.5 0.1 - 1.0 K/uL   Eosinophils Relative 0 %   Eosinophils Absolute 0.0 0.0 - 0.5 K/uL   Basophils  Relative 0 %   Basophils Absolute 0.0 0.0 - 0.1 K/uL   Immature Granulocytes 1 %   Abs Immature Granulocytes 0.07 0.00 - 0.07 K/uL  Comprehensive metabolic panel  Result Value Ref Range   Sodium 130 (L) 135 - 145 mmol/L   Potassium 3.1 (L) 3.5 - 5.1 mmol/L   Chloride 85 (L) 98 - 111 mmol/L  CO2 32 22 - 32 mmol/L   Glucose, Bld 111 (H) 70 - 99 mg/dL   BUN 32 (H) 8 - 23 mg/dL   Creatinine, Ser 1.61 0.61 - 1.24 mg/dL   Calcium 9.0 8.9 - 09.6 mg/dL   Total Protein 7.5 6.5 - 8.1 g/dL   Albumin 3.5 3.5 - 5.0 g/dL   AST 43 (H) 15 - 41 U/L   ALT 21 0 - 44 U/L   Alkaline Phosphatase 91 38 - 126 U/L   Total Bilirubin 2.2 (H) 0.3 - 1.2 mg/dL   GFR calc non Af Amer >60 >60 mL/min   GFR calc Af Amer >60 >60 mL/min   Anion gap 13 5 - 15  Fibrin derivatives D-Dimer  Result Value Ref Range   Fibrin derivatives D-dimer ([facility]) 1,230.98 (H) 0.00 - 499.00 ng/mL (FEU)  Procalcitonin  Result Value Ref Range   Procalcitonin 0.15 ng/mL  Lactate dehydrogenase  Result Value Ref Range   LDH 229 (H) 98 - 192 U/L  Ferritin  Result Value Ref Range   Ferritin 300 24 - 336 ng/mL  Triglycerides  Result Value Ref Range   Triglycerides 88 <150 mg/dL  Fibrinogen  Result Value Ref Range   Fibrinogen 700 (H) 210 - 475 mg/dL  C-reactive protein  Result Value Ref Range   CRP 12.2 (H) <1.0 mg/dL  Brain natriuretic peptide  Result Value Ref Range   B Natriuretic Peptide 463.0 (H) 0.0 - 100.0 pg/mL  Urinalysis, Complete w Microscopic  Result Value Ref Range   Color, Urine YELLOW (A) YELLOW   APPearance CLEAR (A) CLEAR   Specific Gravity, Urine 1.015 1.005 - 1.030   pH 6.0 5.0 - 8.0   Glucose, UA NEGATIVE NEGATIVE mg/dL   Hgb urine dipstick SMALL (A) NEGATIVE   Bilirubin Urine NEGATIVE NEGATIVE   Ketones, ur NEGATIVE NEGATIVE mg/dL   Protein, ur 30 (A) NEGATIVE mg/dL   Nitrite NEGATIVE NEGATIVE   Leukocytes,Ua NEGATIVE NEGATIVE   RBC / HPF 0-5 0 - 5 RBC/hpf   WBC, UA 0-5 0 - 5 WBC/hpf   Bacteria,  UA NONE SEEN NONE SEEN   Squamous Epithelial / LPF 0-5 0 - 5  Protime-INR  Result Value Ref Range   Prothrombin Time 24.9 (H) 11.4 - 15.2 seconds   INR 2.3 (H) 0.8 - 1.2  Protime-INR  Result Value Ref Range   Prothrombin Time 16.7 (H) 11.4 - 15.2 seconds   INR 1.4 (H) 0.8 - 1.2  Protime-INR  Result Value Ref Range   Prothrombin Time 15.9 (H) 11.4 - 15.2 seconds   INR 1.3 (H) 0.8 - 1.2  Protime-INR  Result Value Ref Range   Prothrombin Time 15.2 11.4 - 15.2 seconds   INR 1.2 0.8 - 1.2  Basic metabolic panel  Result Value Ref Range   Sodium 135 135 - 145 mmol/L   Potassium 3.5 3.5 - 5.1 mmol/L   Chloride 100 98 - 111 mmol/L   CO2 26 22 - 32 mmol/L   Glucose, Bld 137 (H) 70 - 99 mg/dL   BUN 24 (H) 8 - 23 mg/dL   Creatinine, Ser 0.45 0.61 - 1.24 mg/dL   Calcium 8.1 (L) 8.9 - 10.3 mg/dL   GFR calc non Af Amer >60 >60 mL/min   GFR calc Af Amer >60 >60 mL/min   Anion gap 9 5 - 15  CBC  Result Value Ref Range   WBC 6.2 4.0 - 10.5 K/uL   RBC 4.19 (L) 4.22 -  5.81 MIL/uL   Hemoglobin 10.8 (L) 13.0 - 17.0 g/dL   HCT 40.932.5 (L) 81.139.0 - 91.452.0 %   MCV 77.6 (L) 80.0 - 100.0 fL   MCH 25.8 (L) 26.0 - 34.0 pg   MCHC 33.2 30.0 - 36.0 g/dL   RDW 78.215.3 95.611.5 - 21.315.5 %   Platelets 165 150 - 400 K/uL   nRBC 0.0 0.0 - 0.2 %  Lactate dehydrogenase  Result Value Ref Range   LDH 205 (H) 98 - 192 U/L  Hepatic function panel  Result Value Ref Range   Total Protein 6.5 6.5 - 8.1 g/dL   Albumin 3.2 (L) 3.5 - 5.0 g/dL   AST 71 (H) 15 - 41 U/L   ALT 24 0 - 44 U/L   Alkaline Phosphatase 80 38 - 126 U/L   Total Bilirubin 1.7 (H) 0.3 - 1.2 mg/dL   Bilirubin, Direct 0.5 (H) 0.0 - 0.2 mg/dL   Indirect Bilirubin 1.2 (H) 0.3 - 0.9 mg/dL  Troponin I (High Sensitivity)  Result Value Ref Range   Troponin I (High Sensitivity) 49 (H) <18 ng/L  Troponin I (High Sensitivity)  Result Value Ref Range   Troponin I (High Sensitivity) 37 (H) <18 ng/L   I personally reviewed labs  Imaging: CT head: 1. Subdural  hemorrhage overlying the entire right convexity and posterior falx is unchanged in appearance and size from the prior exam. No new hemorrhage.  2. Stable areas of encephalomalacia involving the right parietooccipital lobe and left occipital lobe.  Impression/Plan:  John Rodgers is admitted for concern for new subdural hematoma in the right parietal and occipital area with layering along the tentorium.  His exam currently is inconsistent and although I am unsure of his baseline, I do not see any obvious focal deficits to be explained by the subdural hematoma.  If he does not fully return to his baseline, it may be worthwhile pursuing an EEG to rule out any obvious seizure.  He may need further stroke work-up also given his history.  Given the subdural hematoma, I do recommend reversal of his anticoagulation and repeat scan in 6 hours.  If that scan is stable, no intervention will be needed for this finding.   1.  Diagnosis: Subdural hematoma  2.  Plan -If repeat CT scan is stable, recommend staying off anticoagulation for minimum of 5 days.  We can follow-up as an outpatient in 2 to 3 weeks for repeat CT scan.

## 2019-08-09 ENCOUNTER — Encounter: Payer: Self-pay | Admitting: Internal Medicine

## 2019-08-09 DIAGNOSIS — Z515 Encounter for palliative care: Secondary | ICD-10-CM

## 2019-08-09 DIAGNOSIS — J90 Pleural effusion, not elsewhere classified: Secondary | ICD-10-CM

## 2019-08-09 DIAGNOSIS — R4182 Altered mental status, unspecified: Secondary | ICD-10-CM

## 2019-08-09 DIAGNOSIS — Z7189 Other specified counseling: Secondary | ICD-10-CM

## 2019-08-09 LAB — PROTIME-INR
INR: 1.2 (ref 0.8–1.2)
Prothrombin Time: 15.2 seconds (ref 11.4–15.2)

## 2019-08-09 MED ORDER — ORAL CARE MOUTH RINSE
15.0000 mL | Freq: Two times a day (BID) | OROMUCOSAL | Status: DC
  Administered 2019-08-10: 15 mL via OROMUCOSAL

## 2019-08-09 MED ORDER — RAMIPRIL 5 MG PO CAPS
5.0000 mg | ORAL_CAPSULE | Freq: Every day | ORAL | Status: DC
  Filled 2019-08-09 (×2): qty 1

## 2019-08-09 NOTE — Progress Notes (Addendum)
Progress Note    John Rodgers  KCL:[id number] DOB: Sep 16, 1946  DOA: 08/07/2019 PCP: Juluis Pitch, MD      Brief Narrative:    Medical records reviewed and are as summarized below:  John Rodgers is an 73 y.o. male with history significant for dementia, blindness, hearing impairment, stroke, diastolic CHF, atrial fibrillation on warfarin, permanent pacemaker in place, CAD, seizures, hypertension, venous insufficiency ulcers.  He presented to the hospital with generalized weakness, hypoxia, altered mental status.  He was found to have acute subdural hematoma.      Assessment/Plan:   Principal Problem:   Subdural hematoma (HCC) Active Problems:   Peripheral artery disease (HCC)   Essential hypertension   Cardiac pacemaker in situ   Goals of care, counseling/discussion   Permanent atrial fibrillation (HCC)   Vascular dementia without behavioral disturbance (HCC)   Chronic diastolic CHF (congestive heart failure) (HCC)   Hyponatremia   Hypokalemia   History of seizures   Pressure injury of skin   Palliative care by specialist   Altered mental status   Body mass index is 25.11 kg/m.   Family Communication/Anticipated D/C date and plan/Code Status   DVT prophylaxis: SCDs Code Status: DNR Family Communication:  Disposition Plan: Possible discharge to rehab facility in 2 to 3 days  Acute subdural hematoma with encephalopathy: Repeat CT scan shows that hematoma is stable.  No surgical intervention required per neurosurgeon.  Mental status is improving.  Moderate to large right-sided pleural effusion: Thoracentesis will be considered once patient is more stable.  Hyponatremia: Improved.  Discontinue IV fluids  Hypokalemia: Improved  Atrial fibrillation: Warfarin on hold  Coumadin coagulopathy: Improved status post vitamin K and [place]  Stage II sacral decubitus ulcer (present on admission): Continue local wound care  Patient also has history of chronic  diastolic CHF, dementia and stroke  History of recent COVID-19 infection.    Subjective:   He is confused and unable to provide an adequate history  Objective:    Vitals:   08/09/19 0642 08/09/19 1013 08/09/19 1018 08/09/19 1551  BP:  135/72  (!) 164/69  Pulse: 85 65  91  Resp:  17  18  Temp:  97.7 F (36.5 C)  97.7 F (36.5 C)  TempSrc:  Oral  Oral  SpO2:  (!) 87% 92% 100%  Weight:      Height:        Intake/Output Summary (Last 24 hours) at 08/09/2019 1737 Last data filed at 08/09/2019 1500 Gross per 24 hour  Intake 768.49 ml  Output 3000 ml  Net -2231.51 ml   Filed Weights   08/07/19 0916  Weight: 79.4 kg    Exam:  GEN: NAD SKIN: stage II sacral decubitus ulcer EYES: PERRL ENT: MMM CV: RRR PULM: CTA B ABD: soft, ND, NT, +BS CNS: AAO x person and place, left sided appears weaker than right EXT: No edema or tenderness   Data Reviewed:   I have personally reviewed following labs and imaging studies:  Labs: Labs show the following:   Basic Metabolic Panel: Recent Labs  Lab 08/03/19 0917 08/07/19 0931 08/08/19 0605  NA 132* 130* 135  K 3.3* 3.1* 3.5  CL 87* 85* 100  CO2 31 32 26  GLUCOSE 104* 111* 137*  BUN 31* 32* 24*  CREATININE 0.87 0.84 0.67  CALCIUM 9.2 9.0 8.1*   GFR Estimated Creatinine Clearance: 84.9 mL/min (by C-G formula based on SCr of 0.67 mg/dL). Liver Function Tests: Recent Labs  Lab 08/03/19 0917 08/07/19 0931 08/08/19 0605  AST 35 43* 71*  ALT 18 21 24   ALKPHOS 93 91 80  BILITOT 1.5* 2.2* 1.7*  PROT 7.1 7.5 6.5  ALBUMIN 3.5 3.5 3.2*   No results for input(s): LIPASE, AMYLASE in the last 168 hours. No results for input(s): AMMONIA in the last 168 hours. Coagulation profile Recent Labs  Lab 08/08/19 0605 08/08/19 1042 08/08/19 1421 08/08/19 1755 08/09/19 0331  INR 1.2 1.2 1.2 1.2 1.2    CBC: Recent Labs  Lab 08/03/19 0917 08/07/19 0931 08/08/19 0605  WBC 5.8 7.3 6.2  NEUTROABS 4.8 6.2  --   HGB  13.8 12.4* 10.8*  HCT 42.3 36.5* 32.5*  MCV 79.1* 76.4* 77.6*  PLT 146* 139* 165   Cardiac Enzymes: No results for input(s): CKTOTAL, CKMB, CKMBINDEX, TROPONINI in the last 168 hours. BNP (last 3 results) No results for input(s): PROBNP in the last 8760 hours. CBG: No results for input(s): GLUCAP in the last 168 hours. D-Dimer: No results for input(s): DDIMER in the last 72 hours. Hgb A1c: No results for input(s): HGBA1C in the last 72 hours. Lipid Profile: Recent Labs    08/07/19 0931  TRIG 88   Thyroid function studies: No results for input(s): TSH, T4TOTAL, T3FREE, THYROIDAB in the last 72 hours.  Invalid input(s): FREET3 Anemia work up: Recent Labs    08/07/19 0931  FERRITIN 300   Sepsis Labs: Recent Labs  Lab 08/03/19 0917 08/07/19 0929 08/07/19 0931 08/07/19 1107 08/08/19 0605  PROCALCITON  --   --  0.15  --   --   WBC 5.8  --  7.3  --  6.2  LATICACIDVEN  --  1.5  --  1.3  --     Microbiology Recent Results (from the past 240 hour(s))  Blood Culture (routine x 2)     Status: None (Preliminary result)   Collection Time: 08/07/19  9:29 AM   Specimen: BLOOD  Result Value Ref Range Status   Specimen Description BLOOD LEFT ANTECUBITAL  Final   Special Requests   Final    BOTTLES DRAWN AEROBIC AND ANAEROBIC Blood Culture adequate volume   Culture   Final    NO GROWTH 2 DAYS Performed at [facility], [address], [place], [place]    Report Status PENDING  Incomplete  Blood Culture (routine x 2)     Status: None (Preliminary result)   Collection Time: 08/07/19  9:33 AM   Specimen: BLOOD  Result Value Ref Range Status   Specimen Description BLOOD BLOOD LEFT FOREARM  Final   Special Requests   Final    BOTTLES DRAWN AEROBIC AND ANAEROBIC Blood Culture adequate volume   Culture   Final    NO GROWTH 2 DAYS Performed at [facility], [address] [place], [place]    Report Status PENDING  Incomplete  MRSA  PCR Screening     Status: None   Collection Time: 08/08/19  4:47 PM   Specimen: Nasal Mucosa; Nasopharyngeal  Result Value Ref Range Status   MRSA by PCR NEGATIVE NEGATIVE Final    Comment:        The GeneXpert MRSA Assay (FDA approved for NASAL specimens only), is one component of a comprehensive MRSA colonization surveillance program. It is not intended to diagnose MRSA infection nor to guide or monitor treatment for MRSA infections. Performed at [facility], [address], [place], [place]  Procedures and diagnostic studies:  Ct Head Wo Contrast  Result Date: 08/07/2019 CLINICAL DATA:  Follow-up of subdural hemorrhage EXAM: CT HEAD WITHOUT CONTRAST TECHNIQUE: Contiguous axial images were obtained from the base of the skull through the vertex without intravenous contrast. COMPARISON:  August 07, 2019 at 10:34 a.m. FINDINGS: Brain: Again noted is a large area of encephalomalacia involving the right parietooccipital lobe and left occipital lobe. The acute subdural hemorrhage over the posterior falx and overlying the entire right convexity is unchanged in size and appearance from the prior exam. This is most notable is along the posterior falx and right occipital lobe. This measures approximately 6 mm in transverse dimension over the posterior falx and a maximum diameter over the posterior right occipital lobe of approximately 6.5 mm. No new extra-axial collections are seen. No midline shift is noted. Again noted is ex vacuo dilatation of the posterior horn of the right lateral ventricle. The ventricles are otherwise stable in appearance. Vascular: No hyperdense vessel or unexpected calcification. Again noted is dense internal carotid artery calcifications. Skull: The skull is intact. No fracture or focal lesion identified. Sinuses/Orbits: The visualized paranasal sinuses and mastoid air cells are clear. The orbits and globes intact. Other: None IMPRESSION: 1. Subdural  hemorrhage overlying the entire right convexity and posterior falx is unchanged in appearance and size from the prior exam. No new hemorrhage. 2. Stable areas of encephalomalacia involving the right parietooccipital lobe and left occipital lobe. Electronically Signed   By: Jonna ClarkBindu  Avutu M.D.   On: 08/07/2019 19:26    Medications:    naLOXone (NARCAN)  injection  0.4 mg Intravenous Once   [START ON 08/10/2019] ramipril  5 mg Oral Daily   Continuous Infusions:    LOS: 2 days   Mahogani Holohan  [facility] Pager [phone].   *Please refer to [email or web address], password [email or web address] to get updated schedule on who will round on this patient, as hospitalists switch teams weekly. If 7PM-7AM, please contact night-coverage at [email or web address], password [email or web address] for any overnight needs.  08/09/2019, 5:37 PM

## 2019-08-09 NOTE — Progress Notes (Addendum)
SLP Cancellation Note  Patient Details Name: John Rodgers MRN: [id number] DOB: [birth year]   Cancelled treatment:       Reason Eval/Treat Not Completed: (chart reviewed; consulted Palliative Care).  Per discussion w/ Palliative Care NP, pt's family is monitoring pt's presentation/status to see if any improvement in status overall b/f making further decisions re: GOC. BSE was completed yesterday, 11/10/202, w/ recommendation for a dysphagia diet noting increased risks for aspiration. Noted pt had increased agitation per Nursing notes last PM requiring Mitts.  ST services continues to recommend a most conservative diet consistency w/ aspiration precautions, but if pt is unable to demonstrate safe, adequate toleration of this modified/restricted diet consistency, then NPO status w/ the option of alternative means of feeding would be recommended. Updated Palliative Care NP who will f/u w/ pt's family today for [place]. Precautions placed in room; NSG monitoring pt w/ oral intake for s/s of aspiration; oral care ST services will continue to monitor pt's status while admitted.      Orinda Kenner, MS, CCC-SLP Watson,Katherine 08/09/2019, 3:18 PM

## 2019-08-09 NOTE — Consult Note (Signed)
Consultation Note Date: 08/09/2019   Patient Name: John Rodgers  DOB: Oct 15, 1945  MRN: [id number]  Age / Sex: 73 y.o., male  PCP: John Pitch, MD Referring Physician: Jennye Boroughs, MD  Reason for Consultation: Establishing goals of care  HPI/Patient Profile: 73 y.o. male  with past medical history of dementia, CVA left sided weakness, legally blind, diastolic CHF, afib on warfarin, pacemaker, CAD, HTN, seizures admitted on 08/07/2019 with weakness, fall, hypoxia. On 10/19, patient tested positive for covid, asymptomatic besides weakness. 11/5 patient experienced a fall seen in ER, CT head negative. This admission, CT head showed severe old encephalomalacia and new right occipital ADH without mass effect. Neurosurgery recommended non-operative management. Repeat CT at 6 hours with unchanged hemorrhage. Palliative medicine consultation for goals of care.   Clinical Assessment and Goals of Care:  Patient was assessed by this PMT provider on 08/08/19 afternoon. At this time, patient would open eyes to voice but very drowsy and confused. Very poor oral intake. Unable to take pills.   This AM, 11/11 upon arrival to room, patient awake and alert. Goes by "John Rodgers can tell nephew and I his name, birthday, and that he is at the hospital. He ate 50% of pureed breakfast for nurse tech. He does not appear to be in pain or discomfort.    Nephew/POA John Rodgers) and I met in family conference room to discuss diagnosis, prognosis, [place], EOL wishes, disposition and options.  Introduced Palliative Medicine as specialized medical care for people living with serious illness. It focuses on providing relief from the symptoms and stress of a serious illness. The goal is to improve quality of life for both the patient and the family.  We discussed a brief life review of the patient. John Rodgers has never been married and without  children. He has been living in a nursing home for 20+ years, following debility from a stroke. Although disabled from the stroke, John Rodgers shares that up until 1 week ago, his uncle was the most "functional and independent" resident living in [place] home. He is legally blind. Appetite was good and on a regular diet. Diagnosed with dementia but up until 1 week ago, dementia was mild.   Discussed events leading up to admission and course of hospitalization including diagnoses, interventions, and plan of care.   I attempted to elicit values and goals of care important to the patient and nephew. John Rodgers shares that his uncle "fears death like nobody's business."  Advanced directives, concepts specific to code status, artifical feeding and hydration were discussed. John Rodgers confirms decision for DNR code status and his belief that John Rodgers would not want aggressive measures at EOL including life support or feeding tube. John Rodgers shares that his uncle would not want to "linger."   John Rodgers and I were both surprised by how awake and alert John Rodgers is today compared to yesterday and also that he was willing to eat breakfast. We discussed watchful waiting for the next 24 hours including PT/OT consults to see if he will work with therapy.  John Rodgers and I plan to meet again in AM to further discuss [place], scheduled for 10am.   Introduced and discussed MOST form. PMT contact information given. Questions and concerns addressed.    SUMMARY OF RECOMMENDATIONS    Patient's nephew John Rodgers) is next-of-kin/POA. Requested copy of advance directive.  Clinically, patient more awake/alert and ate breakfast this AM. Watchful waiting. Plan to f/u with nephew 11/12 at 10am.   PT/OT consults  Nephew does confirm DNR/DNI code status and that his uncle would NOT want a feeding tube. Introduced MOST form and may complete in AM if nephew ready to complete.   Code Status/Advance Care Planning:  DNR  Symptom  Management:   Per attending  Palliative Prophylaxis:   Aspiration, Delirium Protocol, Frequent Pain Assessment, Oral Care and Turn Reposition  Psycho-social/Spiritual:   Desire for further Chaplaincy support: yes  Additional Recommendations: Caregiving  Support/Resources, Compassionate Wean Education and Education on Hospice  Prognosis:   Unable to determine  Discharge Planning: To Be Determined      Primary Diagnoses: Present on Admission:  Subdural hematoma (HCC)  Vascular dementia without behavioral disturbance (HCC)  Permanent atrial fibrillation ([place])  Peripheral artery disease ([place])  Essential hypertension  Cardiac pacemaker in situ  Hypokalemia   I have reviewed the medical record, interviewed the patient and family, and examined the patient. The following aspects are pertinent.  Past Medical History:  Diagnosis Date   Arthritis    CHF (congestive heart failure) (HCC)    Coronary artery disease    Dementia (HCC)    VASCULAR   Dementia (HCC)    Diarrhea    SEVERAL DAYS/ GIVEN MED TO STOP AT HOME HE LIVES IN   Dysrhythmia    AFIB   GERD (gastroesophageal reflux disease)    Headache    MIGRAINE   Heart murmur    SURGICAL REPAIR   HOH (hard of hearing)    Hypercholesterolemia    Hypertension    Myocardial infarction ([place])    Neuromuscular disorder ([place])    MUSCLE WEAKNESS   Osteoporosis    Peripheral vascular disease ([place])    Presence of permanent cardiac pacemaker    Seizures ([place])    x1 years ago no longer on anti seizure medicines   Stroke ([place]) 1996   more weakness left side CEREBELLAR STROKE SYNDROME   Tunnel vision    Vision loss    bil tunnel vision   Social History   Socioeconomic History   Marital status: Single    Spouse name: Not on file   Number of children: Not on file   Years of education: Not on file   Highest education level: Not on file  Occupational History   Not on file  Social Needs    Financial resource strain: Not on file   Food insecurity    Worry: Not on file    Inability: Not on file   Transportation needs    Medical: Not on file    Non-medical: Not on file  Tobacco Use   Smoking status: Former Smoker   Smokeless tobacco: Never Used  Substance and Sexual Activity   Alcohol use: No   Drug use: No   Sexual activity: Not on file  Lifestyle   Physical activity    Days per week: Not on file    Minutes per session: Not on file   Stress: Not on file  Relationships   Social connections    Talks on phone: Not on file  Gets together: Not on file    Attends religious service: Not on file    Active member of club or organization: Not on file    Attends meetings of clubs or organizations: Not on file    Relationship status: Not on file  Other Topics Concern   Not on file  Social History Narrative   Not on file   Family History  Family history unknown: Yes   Scheduled Meds:  naLOXone (NARCAN)  injection  0.4 mg Intravenous Once   Continuous Infusions:  dextrose 5 % and 0.9 % NaCl with KCl 40 mEq/L 125 mL/hr at 08/09/19 0641   PRN Meds:.acetaminophen **OR** acetaminophen, labetalol, metoprolol tartrate, ondansetron **OR** ondansetron (ZOFRAN) IV Medications Prior to Admission:  Prior to Admission medications   Medication Sig Start Date End Date Taking? Authorizing Provider  acetaminophen (TYLENOL) 325 MG tablet Take 650 mg by mouth every 6 (six) hours as needed for mild pain.   Yes [provider]  aspirin EC 81 MG tablet Take 1 tablet (81 mg total) by mouth daily. 02/05/16  Yes Schnier, Dolores Lory, MD  calcium gluconate 500 MG tablet Take 500 mg by mouth 2 (two) times daily.   Yes [provider]  donepezil (ARICEPT) 10 MG tablet Take 10 mg by mouth daily.    Yes [provider]  furosemide (LASIX) 20 MG tablet Take 20 mg by mouth daily.  12/24/16  Yes [provider]  HYDROcodone-acetaminophen  (NORCO/VICODIN) 5-325 MG tablet Take 2 tablets by mouth every 6 (six) hours as needed for moderate pain.    Yes [provider]  hydroxypropyl methylcellulose / hypromellose (ISOPTO TEARS / GONIOVISC) 2.5 % ophthalmic solution Place 2 drops into both eyes daily as needed for dry eyes.   Yes [provider]  loperamide (IMODIUM A-D) 2 MG tablet Take 4 mg by mouth See admin instructions. Give 2 tablets after first loose stool and 1 tablet after each consecutive stool   Yes [provider]  memantine (NAMENDA) 10 MG tablet Take 10 mg by mouth 2 (two) times daily.    Yes [provider]  metolazone (ZAROXOLYN) 2.5 MG tablet Take 2.5 mg by mouth daily.   Yes [provider]  metoprolol tartrate (LOPRESSOR) 25 MG tablet Take 37.5 mg by mouth 2 (two) times daily.    Yes [provider]  Multiple Vitamin (MULTIVITAMIN WITH MINERALS) TABS tablet Take 1 tablet by mouth daily.   Yes [provider]  ramipril (ALTACE) 5 MG capsule Take 5 mg by mouth daily.    Yes [provider]  simvastatin (ZOCOR) 10 MG tablet Take 10 mg by mouth at bedtime.    Yes [provider]  tamsulosin (FLOMAX) 0.4 MG CAPS capsule Take 0.4 mg by mouth daily. 05/04/17  Yes [provider]  Vitamin D, Ergocalciferol, (DRISDOL) 50000 units CAPS capsule Take 50,000 Units by mouth every 30 (thirty) days. (take on the 7th of the month)   Yes [provider]   No Known Allergies Review of Systems  Unable to perform ROS: Mental status change   Physical Exam Vitals signs and nursing note reviewed.  Constitutional:      General: He is awake.     Appearance: He is ill-appearing.  HENT:     Head: Normocephalic and atraumatic.  Pulmonary:     Effort: No tachypnea, accessory muscle usage or respiratory distress.  Abdominal:     Tenderness: There is no abdominal tenderness.  Skin:  General: Skin is warm and dry.  Neurological:     Mental  Status: He is alert.     Comments: This AM, patient alert to person, birthday, and the 'hospital' Awake and following commands.     Vital Signs: BP (!) 164/69 (BP Location: Right Arm)    Pulse 91    Temp 97.7 F (36.5 C) (Oral)    Resp 18    Ht '5\' 10"'$  (1.778 m)    Wt 79.4 kg    SpO2 100%    BMI 25.11 kg/m  Pain Scale: 0-10   Pain Score: Asleep   SpO2: SpO2: 100 % O2 Device:SpO2: 100 % O2 Flow Rate: .O2 Flow Rate (L/min): 2 L/min  IO: Intake/output summary:   Intake/Output Summary (Last 24 hours) at 08/09/2019 1606 Last data filed at 08/09/2019 1500 Gross per 24 hour  Intake 768.49 ml  Output 3000 ml  Net -2231.51 ml    LBM: Last BM Date: (unknown) Baseline Weight: Weight: 79.4 kg Most recent weight: Weight: 79.4 kg     Palliative Assessment/Data: PPS 30%   Flowsheet Rows     Most Recent Value  Intake Tab  Referral Department  Hospitalist  Unit at Time of Referral  Med/Surg Unit  Palliative Care Primary Diagnosis  Neurology  Palliative Care Type  New Palliative care  Reason for referral  Clarify Goals of Care  Date first seen by Palliative Care  08/09/19  Clinical Assessment  Palliative Performance Scale Score  30%  Psychosocial & Spiritual Assessment  Palliative Care Outcomes  Patient/Family meeting held?  Yes  Who was at the meeting?  nephew/POA  Palliative Care Outcomes  Clarified goals of care, Provided end of life care assistance, Provided psychosocial or spiritual support, ACP counseling assistance      Time In: 0840 Time Out: 0940 Time Total: 20mn Greater than 50%  of this time was spent counseling and coordinating care related to the above assessment and plan.  Signed by:  MIhor Dow DNP, FNP-C Palliative Medicine Team  Phone: [phone]: [phone]  Please contact Palliative Medicine Team phone at [phone] questions and concerns.  For individual provider: See AShea Evans

## 2019-08-09 NOTE — Progress Notes (Signed)
MEDICATION RELATED MONITORING NOTE   Pharmacy Monitoring post-Kcentra Administration Indication: Subdural hematoma in patient on warfarin  Vital Signs: Temp: 97.7 F (36.5 C) (11/11 1013) Temp Source: Oral (11/11 1013) BP: 135/72 (11/11 1013) Pulse Rate: 65 (11/11 1013)  Labs: Recent Labs    08/07/19 0931 08/08/19 0605  WBC 7.3 6.2  HGB 12.4* 10.8*  HCT 36.5* 32.5*  PLT 139* 165  CREATININE 0.84 0.67  ALBUMIN 3.5 3.2*  PROT 7.5 6.5  AST 43* 71*  ALT 21 24  ALKPHOS 91 80  BILITOT 2.2* 1.7*  BILIDIR  --  0.5*  IBILI  --  1.2*   Estimated Creatinine Clearance: 84.9 mL/min (by C-G formula based on SCr of 0.67 mg/dL).   Medications:  Warfarin for atrial fibrillation (held since 11/3)  Assessment: Patient is a 73 y/o M with hx including dementia, CVA, blindness, atrial fibrillation on warfarin and with pacemaker, CAD, hx of seizures not on AEDs who presented to [facility] from facility with generalized weakness, hypoxia, decreased sensorium. Patient with recent supratherapeutic INR to 11 at facility on 11/3 and warfarin was held. Patient sustained fall on 11/5 and was seen in ED and found to have INR of 8.1. Warfarin held and patient discharged back to SNF. Returned to ED 11/9 for aforementioned reasons and found to have subdural hematoma and was treated with Kcentra and vitamin K.    11/9 INR 2.3  11/9 13:47 INR 1.4 (post-Kcentra) 11/10 00:38 INR 1.3 11/10 06:05 INR 1.2 11/11 0331 INR 1.2   Plan:  -INR q6h x 4 then daily x 2 per post-Kcentra monitoring protocol   Rayna Sexton, PharmD, BCPS Clinical Pharmacist 08/09/2019 10:48 AM

## 2019-08-09 NOTE — Progress Notes (Signed)
PT Cancellation Note  Patient Details Name: John Rodgers MRN: [id number] DOB: 02-Dec-1945   Cancelled Treatment:    Reason Eval/Treat Not Completed: Other (comment). Consult received and chart reviewed. Upon arrival, pt confused with mitts on. Pt isn't oriented to place/time/situation. Reports he hasn't ambulated in a long time. Unsure of accurate history. Upon strength testing, pt becomes aggravated as he needs to urinate and won't believe therapist that he has foley catheter. Discussed with RN. Will re-attempt tomorrow to see if pt better able to participate.   Mickal Meno 08/09/2019, 3:47 PM  Greggory Stallion, PT, DPT [phone]

## 2019-08-10 ENCOUNTER — Inpatient Hospital Stay: Payer: Medicare Other

## 2019-08-10 ENCOUNTER — Ambulatory Visit (INDEPENDENT_AMBULATORY_CARE_PROVIDER_SITE_OTHER): Payer: Medicare Other | Admitting: Nurse Practitioner

## 2019-08-10 LAB — CBC WITH DIFFERENTIAL/PLATELET
Abs Immature Granulocytes: 0.03 10*3/uL (ref 0.00–0.07)
Basophils Absolute: 0 10*3/uL (ref 0.0–0.1)
Basophils Relative: 0 %
Eosinophils Absolute: 0 10*3/uL (ref 0.0–0.5)
Eosinophils Relative: 1 %
HCT: 31.8 % — ABNORMAL LOW (ref 39.0–52.0)
Hemoglobin: 10.2 g/dL — ABNORMAL LOW (ref 13.0–17.0)
Immature Granulocytes: 1 %
Lymphocytes Relative: 7 %
Lymphs Abs: 0.4 10*3/uL — ABNORMAL LOW (ref 0.7–4.0)
MCH: 26 pg (ref 26.0–34.0)
MCHC: 32.1 g/dL (ref 30.0–36.0)
MCV: 81.1 fL (ref 80.0–100.0)
Monocytes Absolute: 0.3 10*3/uL (ref 0.1–1.0)
Monocytes Relative: 5 %
Neutro Abs: 5.3 10*3/uL (ref 1.7–7.7)
Neutrophils Relative %: 86 %
Platelets: 167 10*3/uL (ref 150–400)
RBC: 3.92 MIL/uL — ABNORMAL LOW (ref 4.22–5.81)
RDW: 16.4 % — ABNORMAL HIGH (ref 11.5–15.5)
WBC: 6.2 10*3/uL (ref 4.0–10.5)
nRBC: 0 % (ref 0.0–0.2)

## 2019-08-10 LAB — BODY FLUID CELL COUNT WITH DIFFERENTIAL
Eos, Fluid: 0 %
Lymphs, Fluid: 79 %
Monocyte-Macrophage-Serous Fluid: 4 %
Neutrophil Count, Fluid: 17 %
Total Nucleated Cell Count, Fluid: 674 cu mm

## 2019-08-10 LAB — BASIC METABOLIC PANEL
Anion gap: 10 (ref 5–15)
BUN: 17 mg/dL (ref 8–23)
CO2: 23 mmol/L (ref 22–32)
Calcium: 8.8 mg/dL — ABNORMAL LOW (ref 8.9–10.3)
Chloride: 106 mmol/L (ref 98–111)
Creatinine, Ser: 0.71 mg/dL (ref 0.61–1.24)
GFR calc Af Amer: >60 mL/min (ref >60)
GFR calc non Af Amer: >60 mL/min (ref >60)
Glucose, Bld: 101 mg/dL — ABNORMAL HIGH (ref 70–99)
Potassium: 4.1 mmol/L (ref 3.5–5.1)
Sodium: 139 mmol/L (ref 135–145)

## 2019-08-10 LAB — PROTEIN, PLEURAL OR PERITONEAL FLUID: Total protein, fluid: 3.5 g/dL

## 2019-08-10 LAB — PROTIME-INR
INR: 1.2 (ref 0.8–1.2)
Prothrombin Time: 15.3 seconds — ABNORMAL HIGH (ref 11.4–15.2)

## 2019-08-10 LAB — LACTATE DEHYDROGENASE, PLEURAL OR PERITONEAL FLUID: LD, Fluid: 91 U/L — ABNORMAL HIGH (ref 3–23)

## 2019-08-10 MED ORDER — MORPHINE SULFATE (PF) 2 MG/ML IV SOLN
2.0000 mg | Freq: Four times a day (QID) | INTRAVENOUS | Status: DC | PRN
  Administered 2019-08-10: 2 mg via INTRAVENOUS
  Filled 2019-08-10: qty 1

## 2019-08-10 MED ORDER — OXYCODONE HCL 5 MG PO TABS: 5.0000 mg | ORAL_TABLET | Freq: Four times a day (QID) | ORAL | Status: DC | PRN

## 2019-08-10 MED ORDER — ACETAMINOPHEN 325 MG PO TABS: 650.0000 mg | ORAL_TABLET | Freq: Four times a day (QID) | ORAL | Status: DC | PRN

## 2019-08-10 MED ORDER — ACETAMINOPHEN 650 MG RE SUPP: 650.0000 mg | Freq: Four times a day (QID) | RECTAL | Status: DC | PRN

## 2019-08-10 NOTE — Evaluation (Signed)
Physical Therapy Evaluation Patient Details Name: John Rodgers MRN: [id number] DOB: [birth year] Today's Date: 08/10/2019   History of Present Illness  Pt admitted for subdural hematoma with acute subdural hemorrhage, stable through repeat CT. History includes dementia, CVA with L side weakness, CAD, HTN, and seizures. Pt also legally blind describes as tunnel vision.  Clinical Impression  Pt is a pleasantly confused 73 year old man who was admitted for subdural hematoma. Pt performs bed mobility with mod assist, transfers with min assist +2, and ambulation with min assist +2 and RW. Pt confused to date/situation and is blind, needs heavy directions for sequencing. All mobility performed on 2L of O2 with sats decreasing to mid 80s% and returning to 91% at end of session. Pt demonstrates deficits with strength/cognition/mobility. Would benefit from skilled PT to address above deficits and promote optimal return to PLOF. Recommend return back to [facility] with HHPT.    Follow Up Recommendations (return to LTC with HHPT)    Equipment Recommendations  None recommended by PT    Recommendations for Other Services       Precautions / Restrictions Precautions Precautions: Fall Restrictions Weight Bearing Restrictions: No      Mobility  Bed Mobility Overal bed mobility: Needs Assistance Bed Mobility: Supine to Sit;Rolling Rolling: Mod assist   Supine to sit: Mod assist;+2 for physical assistance     General bed mobility comments: needs assist for transitioning to EOB. Tends to post lean, however is able to correct with cues and decrease assistance to cga.  Transfers Overall transfer level: Needs assistance Equipment used: Rolling walker (2 wheeled) Transfers: Sit to/from Stand Sit to Stand: Min assist;+2 physical assistance         General transfer comment: able to stand from standard height bed, needs +2 assist for upright posture. Pt tends to try to sit back down, however then able to  stand upright.  Ambulation/Gait Ambulation/Gait assistance: Min assist;+2 physical assistance Gait Distance (Feet): 10 Feet Assistive device: Rolling walker (2 wheeled) Gait Pattern/deviations: Step-to pattern     General Gait Details: needs heavy cues for sequencing and ambulation from bed ->BSC and then to recliner. Needs significant assist for sequencing and turns as pt is very fearful of falling and unsteady, requiring +2 for safety.  Stairs            Wheelchair Mobility    Modified Rankin (Stroke Patients Only)       Balance Overall balance assessment: Needs assistance;History of Falls Sitting-balance support: Feet supported;Bilateral upper extremity supported Sitting balance-Leahy Scale: Fair     Standing balance support: Bilateral upper extremity supported Standing balance-Leahy Scale: Fair                               Pertinent Vitals/Pain Pain Assessment: No/denies pain    Home Living Family/patient expects to be discharged to:: Skilled nursing facility               Home Equipment: Walker - 2 wheels Additional Comments: per chart review, pt currently living at [facility].    Prior Function Level of Independence: Needs assistance         Comments: poor historian, confused at baseline. Reports he ambulates to get meals, unsure of short/long distances. Reports he uses RW     Hand Dominance        Extremity/Trunk Assessment   Upper Extremity Assessment Upper Extremity Assessment: Generalized weakness(B UE grossly 4/5)  Lower Extremity Assessment Lower Extremity Assessment: Generalized weakness(B LE grossly 3+/5)       Communication   Communication: No difficulties  Cognition Arousal/Alertness: Awake/alert Behavior During Therapy: WFL for tasks assessed/performed Overall Cognitive Status: History of cognitive impairments - at baseline                                        General Comments       Exercises Other Exercises Other Exercises: upon removing bed linen, pt found soiled in bed. + 2 for rolling in bed for cleaning. Pt then needing to transfer to [facility] for another BM, needing +2 assist for positioning on toliet. Pt pulled off condom cath. Needs +2 for standing balance and hygiene   Assessment/Plan    PT Assessment Patient needs continued PT services  PT Problem List Decreased strength;Decreased balance;Decreased mobility       PT Treatment Interventions Gait training;Therapeutic exercise;Balance training    PT Goals (Current goals can be found in the Care Plan section)  Acute Rehab PT Goals Patient Stated Goal: to get stronger PT Goal Formulation: With patient Time For Goal Achievement: 08/24/19 Potential to Achieve Goals: Good    Frequency Min 2X/week   Barriers to discharge        Co-evaluation               AM-PAC PT "6 Clicks" Mobility  Outcome Measure Help needed turning from your back to your side while in a flat bed without using bedrails?: A Lot Help needed moving from lying on your back to sitting on the side of a flat bed without using bedrails?: A Lot Help needed moving to and from a bed to a chair (including a wheelchair)?: A Lot Help needed standing up from a chair using your arms (e.g., wheelchair or bedside chair)?: A Little Help needed to walk in hospital room?: A Lot Help needed climbing 3-5 steps with a railing? : A Lot 6 Click Score: 13    End of Session Equipment Utilized During Treatment: Gait belt;Oxygen Activity Tolerance: Patient tolerated treatment well Patient left: in chair;with chair alarm set Nurse Communication: Mobility status PT Visit Diagnosis: Muscle weakness (generalized) (M62.81);Difficulty in walking, not elsewhere classified (R26.2);Unsteadiness on feet (R26.81);Repeated falls (R29.6);History of falling (Z91.81)    Time: 7672-0947 PT Time Calculation (min) (ACUTE ONLY): 40 min   Charges:   PT  Evaluation $PT Eval Low Complexity: 1 Low PT Treatments $Therapeutic Activity: 23-37 mins        Greggory Stallion, PT, DPT [phone]   [name] 08/10/2019, 1:52 PM

## 2019-08-10 NOTE — Progress Notes (Signed)
[place] Monitoring post-Kcentra Administration Indication: Subdural hematoma in patient on warfarin  Vital Signs: Temp: 98.6 F (37 C) (11/12 0429) Temp Source: Oral (11/12 0429) BP: 154/65 (11/12 0429) Pulse Rate: 77 (11/12 0429)  Labs: Recent Labs    08/07/19 0931 08/08/19 0605  WBC 7.3 6.2  HGB 12.4* 10.8*  HCT 36.5* 32.5*  PLT 139* 165  CREATININE 0.84 0.67  ALBUMIN 3.5 3.2*  PROT 7.5 6.5  AST 43* 71*  ALT 21 24  ALKPHOS 91 80  BILITOT 2.2* 1.7*  BILIDIR  --  0.5*  IBILI  --  1.2*   Estimated Creatinine Clearance: 84.9 mL/min (by C-G formula based on SCr of 0.67 mg/dL).   Medications:  Warfarin for atrial fibrillation (held since 11/3)  Assessment: Patient is a 73 y/o M with hx including dementia, CVA, blindness, atrial fibrillation on warfarin and with pacemaker, CAD, hx of seizures not on AEDs who presented to [facility] from facility with generalized weakness, hypoxia, decreased sensorium. Patient with recent supratherapeutic INR to 11 at facility on 11/3 and warfarin was held. Patient sustained fall on 11/5 and was seen in ED and found to have INR of 8.1. Warfarin held and patient discharged back to SNF. Returned to ED 11/9 for aforementioned reasons and found to have subdural hematoma and was treated with Kcentra and vitamin K.    11/9 INR 2.3  11/9 13:47 INR 1.4 (post-Kcentra) 11/10 00:38 INR 1.3 11/10 06:05 INR 1.2 11/11 0331 INR 1.2 11/12 0315 INR 1.2   Plan:  -INR q6h x 4 then daily x 2 per post-Kcentra monitoring protocol. Post Kcentra monitoring complete   Chinita Greenland PharmD Clinical Pharmacist 08/10/2019

## 2019-08-10 NOTE — Progress Notes (Signed)
   08/10/19 1400  Clinical Encounter Type  Visited With Patient;Health care provider  Visit Type Initial  Referral From Palliative care team  Consult/Referral To Chaplain   Chaplain received a referral to visit the patient from his physician. The physician relayed a request for the patient's pastor to come and provide support. This chaplain visited with the patient who was awake and alert. The patient talked about his family, including his brother, niece and nephew. The patient shared one of his favorite scriptures, which guides his life and helps him to draw strength during difficult times (Numbers 6:24-26). The patient reported that he has led a blessed life and that the evidence of this is as he looks at each day he has lived. The patient shared that he really enjoys beach music, so this chaplain attempted to find a suitable music station on the tv to fit the patient's preference, but to no avail. The patient instead was open to watching television. This chaplain provided support in the form of active and reflective listening, compassionate ministerial presence, life-affirming humor, and prayer.

## 2019-08-10 NOTE — Progress Notes (Addendum)
Progress Note    John Rodgers  OEV:[id number] DOB: 09-11-1946  DOA: 08/07/2019 PCP: Juluis Pitch, MD      Brief Narrative:    Medical records reviewed and are as summarized below:  John Rodgers is an 73 y.o. male with history significant for dementia, blindness, hearing impairment, stroke, diastolic CHF, atrial fibrillation on warfarin, permanent pacemaker in place, CAD, seizures, hypertension, venous insufficiency ulcers.  He presented to the hospital with generalized weakness, hypoxia, altered mental status.  He was found to have acute subdural hematoma.      Assessment/Plan:   Principal Problem:   Subdural hematoma (HCC) Active Problems:   Peripheral artery disease (HCC)   Essential hypertension   Cardiac pacemaker in situ   Goals of care, counseling/discussion   Permanent atrial fibrillation (HCC)   Vascular dementia without behavioral disturbance (HCC)   Chronic diastolic CHF (congestive heart failure) (HCC)   Hyponatremia   Hypokalemia   History of seizures   Pressure injury of skin   Palliative care by specialist   Altered mental status   Pleural effusion   Body mass index is 25.11 kg/m.   Family Communication/Anticipated D/C date and plan/Code Status   DVT prophylaxis: SCDs Code Status: DNR Family Communication: Plan discussed with his nephew Disposition Plan: Possible discharge to rehab facility in 1 to 2 days  Acute subdural hematoma with encephalopathy: Hematoma was stable on repeat CT done on 08/07/2019.  No surgical intervention required per neurosurgeon.  Moderate to large right-sided pleural effusion: Plan for right-sided thoracentesis today.  Hyponatremia: Improved.  Hypokalemia: Improved  Atrial fibrillation: Warfarin on hold  Coumadin coagulopathy: Improved status post vitamin K and [place]  Stage II sacral decubitus ulcer (present on admission): Continue local wound care  Patient also has history of chronic diastolic CHF,  dementia and stroke.  He is legally blind and hard of hearing.  History of recent COVID-19 infection.  Plan of care was discussed with the patient and his nephew (also the healthcare power of attorney) at the bedside.    Subjective:   He complains of pain in the left hip.  Patient is unable to provide an adequate history because of dementia.  Objective:    Vitals:   08/10/19 0429 08/10/19 0754 08/10/19 0915 08/10/19 1425  BP: (!) 154/65 (!) 146/66 (!) 158/62 (!) 162/76  Pulse: 77 63 69 96  Resp: 18 19 18 17   Temp: 98.6 F (37 C) 98.3 F (36.8 C) 98.4 F (36.9 C) 98.2 F (36.8 C)  TempSrc: Oral Oral Oral Oral  SpO2: 92% 93% 96% 100%  Weight:      Height:        Intake/Output Summary (Last 24 hours) at 08/10/2019 1533 Last data filed at 08/10/2019 1124 Gross per 24 hour  Intake 2420.58 ml  Output 300 ml  Net 2120.58 ml   Filed Weights   08/07/19 0916  Weight: 79.4 kg    Exam:  GEN: NAD SKIN: No rash EYES: No pallor or icterus ENT: MMM CV: RRR PULM: CTA B ABD: soft, ND, NT, +BS CNS: AAO x 2 (person and place), left sided weakness (LUE is weaker than the RUE) EXT: No edema or tenderness.  No obvious swelling, erythema, tenderness noted in bilateral hips or extremities.    Data Reviewed:   I have personally reviewed following labs and imaging studies:  Labs: Labs show the following:   Basic Metabolic Panel: Recent Labs  Lab 08/07/19 0931 08/08/19 0605  NA 130* 135  K 3.1* 3.5  CL 85* 100  CO2 32 26  GLUCOSE 111* 137*  BUN 32* 24*  CREATININE 0.84 0.67  CALCIUM 9.0 8.1*   GFR Estimated Creatinine Clearance: 84.9 mL/min (by C-G formula based on SCr of 0.67 mg/dL). Liver Function Tests: Recent Labs  Lab 08/07/19 0931 08/08/19 0605  AST 43* 71*  ALT 21 24  ALKPHOS 91 80  BILITOT 2.2* 1.7*  PROT 7.5 6.5  ALBUMIN 3.5 3.2*   No results for input(s): LIPASE, AMYLASE in the last 168 hours. No results for input(s): AMMONIA in the last 168  hours. Coagulation profile Recent Labs  Lab 08/08/19 1042 08/08/19 1421 08/08/19 1755 08/09/19 0331 08/10/19 0315  INR 1.2 1.2 1.2 1.2 1.2    CBC: Recent Labs  Lab 08/07/19 0931 08/08/19 0605  WBC 7.3 6.2  NEUTROABS 6.2  --   HGB 12.4* 10.8*  HCT 36.5* 32.5*  MCV 76.4* 77.6*  PLT 139* 165   Cardiac Enzymes: No results for input(s): CKTOTAL, CKMB, CKMBINDEX, TROPONINI in the last 168 hours. BNP (last 3 results) No results for input(s): PROBNP in the last 8760 hours. CBG: No results for input(s): GLUCAP in the last 168 hours. D-Dimer: No results for input(s): DDIMER in the last 72 hours. Hgb A1c: No results for input(s): HGBA1C in the last 72 hours. Lipid Profile: No results for input(s): CHOL, HDL, LDLCALC, TRIG, CHOLHDL, LDLDIRECT in the last 72 hours. Thyroid function studies: No results for input(s): TSH, T4TOTAL, T3FREE, THYROIDAB in the last 72 hours.  Invalid input(s): FREET3 Anemia work up: No results for input(s): VITAMINB12, FOLATE, FERRITIN, TIBC, IRON, RETICCTPCT in the last 72 hours. Sepsis Labs: Recent Labs  Lab 08/07/19 0929 08/07/19 0931 08/07/19 1107 08/08/19 0605  PROCALCITON  --  0.15  --   --   WBC  --  7.3  --  6.2  LATICACIDVEN 1.5  --  1.3  --     Microbiology Recent Results (from the past 240 hour(s))  Blood Culture (routine x 2)     Status: None (Preliminary result)   Collection Time: 08/07/19  9:29 AM   Specimen: BLOOD  Result Value Ref Range Status   Specimen Description BLOOD LEFT ANTECUBITAL  Final   Special Requests   Final    BOTTLES DRAWN AEROBIC AND ANAEROBIC Blood Culture adequate volume   Culture   Final    NO GROWTH 3 DAYS Performed at [facility], [address], [place], [place] [address]    Report Status PENDING  Incomplete  Blood Culture (routine x 2)     Status: None (Preliminary result)   Collection Time: 08/07/19  9:33 AM   Specimen: BLOOD  Result Value Ref Range Status   Specimen Description  BLOOD BLOOD LEFT FOREARM  Final   Special Requests   Final    BOTTLES DRAWN AEROBIC AND ANAEROBIC Blood Culture adequate volume   Culture   Final    NO GROWTH 3 DAYS Performed at [facility], [address] [place], [place] [address]    Report Status PENDING  Incomplete  MRSA PCR Screening     Status: None   Collection Time: 08/08/19  4:47 PM   Specimen: Nasal Mucosa; Nasopharyngeal  Result Value Ref Range Status   MRSA by PCR NEGATIVE NEGATIVE Final    Comment:        The GeneXpert MRSA Assay (FDA approved for NASAL specimens only), is one component of a comprehensive MRSA colonization surveillance program. It is  not intended to diagnose MRSA infection nor to guide or monitor treatment for MRSA infections. Performed at [facility], [address] [place], [place] [address]     Procedures and diagnostic studies:  No results found.  Medications:   . mouth rinse  15 mL Mouth Rinse BID  . naLOXone (NARCAN)  injection  0.4 mg Intravenous Once  . ramipril  5 mg Oral Daily   Continuous Infusions:    LOS: 3 days   Tyge Somers  [facility] Pager [phone].   *Please refer to [email or web address], password [email or web address] to get updated schedule on who will round on this patient, as hospitalists switch teams weekly. If 7PM-7AM, please contact night-coverage at [email or web address], password [email or web address] for any overnight needs.  08/10/2019, 3:33 PM

## 2019-08-10 NOTE — TOC Progression Note (Signed)
Transition of Care [facility]) - Progression Note    Patient Details  Name: John Rodgers MRN: [id number] Date of Birth: 08/11/1946  [facility]) CM/SW Contact  Shelbie Hutching, RN Phone Number: 08/10/2019, 4:07 PM  Clinical Narrative:    Patient will be followed by OP palliative at discharge, referral given to Flo Shanks.   Expected Discharge Plan: [place] Barriers to Discharge: Continued Medical Work up  Expected Discharge Plan and Services Expected Discharge Plan: [place]   Discharge Planning Services: CM Consult   Living arrangements for the past 2 months: [place]                                       Social Determinants of Health (SDOH) Interventions    Readmission Risk Interventions No flowsheet data found.

## 2019-08-10 NOTE — TOC Initial Note (Signed)
Transition of Care [facility]) - Initial/Assessment Note    Patient Details  Name: John Rodgers MRN: [id number] Date of Birth: April 22, 1946  Transition of Care [facility]) CM/SW Contact:    Allayne Butcher, RN Phone Number: 08/10/2019, 3:42 PM  Clinical Narrative:                  Patient admitted with a subdural hematoma.  Patient is from [facility] long term care.  Patient reports that he can walk with a rollator, feed himself, and go to the bathroom by himself.  Patient is awake and alert.  Plan will be for patient to return to [facility] when ready for discharge.   Patient did well working with PT today required minimal assist with 2 people.    Expected Discharge Plan: Skilled Nursing Facility Barriers to Discharge: Continued Medical Work up   Patient Goals and CMS Choice Patient states their goals for this hospitalization and ongoing recovery are:: Just wants to have the food he wants- and he would like to walk      Expected Discharge Plan and Services Expected Discharge Plan: Skilled Nursing Facility   Discharge Planning Services: CM Consult   Living arrangements for the past 2 months: Skilled Nursing Facility                                      Prior Living Arrangements/Services Living arrangements for the past 2 months: Skilled Nursing Facility Lives with:: Facility Resident Patient language and need for interpreter reviewed:: Yes Do you feel safe going back to the place where you live?: Yes      Need for Family Participation in Patient Care: Yes (Comment)(long term care resident) Care giver support system in place?: Yes (comment)   Criminal Activity/Legal Involvement Pertinent to Current Situation/Hospitalization: No - Comment as needed  Activities of Daily Living Home Assistive Devices/Equipment: None ADL Screening (condition at time of admission) Patient's cognitive ability adequate to safely complete daily activities?: No Is the patient deaf or have difficulty  hearing?: No Does the patient have difficulty seeing, even when wearing glasses/contacts?: Yes Does the patient have difficulty concentrating, remembering, or making decisions?: Yes Patient able to express need for assistance with ADLs?: No Does the patient have difficulty dressing or bathing?: Yes Independently performs ADLs?: No Communication: Independent Dressing (OT): Dependent Is this a change from baseline?: Change from baseline, expected to last >3 days Grooming: Dependent Is this a change from baseline?: Change from baseline, expected to last >3 days Feeding: Dependent Is this a change from baseline?: Change from baseline, expected to last >3 days Bathing: Dependent Is this a change from baseline?: Change from baseline, expected to last >3 days Toileting: Dependent Is this a change from baseline?: Change from baseline, expected to last >3days In/Out Bed: Dependent Is this a change from baseline?: Change from baseline, expected to last >3 days Walks in Home: Dependent Is this a change from baseline?: Change from baseline, expected to last >3 days Does the patient have difficulty walking or climbing stairs?: Yes Weakness of Legs: Both Weakness of Arms/Hands: Both  Permission Sought/Granted Permission sought to share information with : Case Manager, Magazine features editor, Family Supports Permission granted to share information with : Yes, Verbal Permission Granted     Permission granted to share info w AGENCY: [facility]  Permission granted to share info w Relationship: Nephew     Emotional Assessment Appearance::  Appears stated age Attitude/Demeanor/Rapport: Engaged Affect (typically observed): Accepting Orientation: : Oriented to Self, Oriented to Place Alcohol / Substance Use: Not Applicable Psych Involvement: No (comment)  Admission diagnosis:  Pleural effusion [J90] SDH (subdural hematoma) ([place]) [S06.5X9A] Altered mental status, unspecified altered  mental status type [R41.82] Patient Active Problem List   Diagnosis Date Noted  . Palliative care by specialist   . Altered mental status   . Pleural effusion   . Pressure injury of skin 08/08/2019  . Subdural hematoma ([place]) 08/07/2019  . Chronic diastolic CHF (congestive heart failure) ([place]) 08/07/2019  . Hyponatremia 08/07/2019  . Hypokalemia 08/07/2019  . History of seizures 08/07/2019  . SOB (shortness of breath) 05/11/2019  . Anticoagulated on warfarin 02/15/2018  . Carotid stenosis 01/17/2018  . Atherosclerosis of native arteries of extremity with intermittent claudication ([place]) 07/12/2017  . Other mechanical complication of femoral arterial graft (bypass), sequela 05/07/2017  . Leg pain 04/29/2017  . DJD (degenerative joint disease), lumbosacral 04/29/2017  . Peripheral artery disease ([place]) 03/23/2017  . Essential hypertension 03/23/2017  . Hyperlipidemia 03/23/2017  . Vascular dementia without behavioral disturbance ([place]) 07/29/2016  . Acute CHF (congestive heart failure) ([place]) 05/04/2016  . Disturbances of vision, late effect of stroke 06/25/2014  . Unspecified sequelae of cerebral infarction 04/24/2014  . Goals of care, counseling/discussion 06/13/2013  . Permanent atrial fibrillation ([place]) 09/02/2011  . Cardiac pacemaker in situ 08/10/2011  . Syncope 08/10/2011  . 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

## 2019-08-10 NOTE — Progress Notes (Signed)
New referral for [employer] community Palliative program to follow at [facility] received from Ihor Dow NP. [place] aware. Patient information given to referral. Flo Shanks BSN, RN, [place] [phone]

## 2019-08-10 NOTE — [place] FL2 (Signed)
[place] MEDICAID FL2 LEVEL OF CARE SCREENING TOOL     IDENTIFICATION  Patient Name: John Rodgers Birthdate: 11-29-1945 Sex: male Admission Date (Current Location): 08/07/2019  [place] and [place] Number:  Chiropodist and Address:  [facility], [address] [place], [place] [address]      Provider Number: [id number]  Attending Physician Name and Address:  Lurene Shadow, MD  Relative Name and Phone Number:  Faheem Ziemann [phone]    Current Level of Care: Hospital Recommended Level of Care: Skilled Nursing Facility Prior Approval Number:    Date Approved/Denied:   PASRR Number:    Discharge Plan:      Current Diagnoses: Patient Active Problem List   Diagnosis Date Noted  . Palliative care by specialist   . Altered mental status   . Pleural effusion   . Pressure injury of skin 08/08/2019  . Subdural hematoma (HCC) 08/07/2019  . Chronic diastolic CHF (congestive heart failure) (HCC) 08/07/2019  . Hyponatremia 08/07/2019  . Hypokalemia 08/07/2019  . History of seizures 08/07/2019  . SOB (shortness of breath) 05/11/2019  . Anticoagulated on warfarin 02/15/2018  . Carotid stenosis 01/17/2018  . Atherosclerosis of native arteries of extremity with intermittent claudication (HCC) 07/12/2017  . Other mechanical complication of femoral arterial graft (bypass), sequela 05/07/2017  . Leg pain 04/29/2017  . DJD (degenerative joint disease), lumbosacral 04/29/2017  . Peripheral artery disease (HCC) 03/23/2017  . Essential hypertension 03/23/2017  . Hyperlipidemia 03/23/2017  . Vascular dementia without behavioral disturbance (HCC) 07/29/2016  . Acute CHF (congestive heart failure) (HCC) 05/04/2016  . Disturbances of vision, late effect of stroke 06/25/2014  . Unspecified sequelae of cerebral infarction 04/24/2014  . Goals of care, counseling/discussion 06/13/2013  . Permanent atrial fibrillation (HCC) 09/02/2011  . Cardiac pacemaker  in situ 08/10/2011  . Syncope 08/10/2011  . 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    Orientation RESPIRATION BLADDER Height & Weight     Self, Place  Normal Continent Weight: 79.4 kg Height:  5\' 10"  (177.8 cm)  BEHAVIORAL SYMPTOMS/MOOD NEUROLOGICAL BOWEL NUTRITION STATUS      Continent Diet(Dysphagia diet 1)  AMBULATORY STATUS COMMUNICATION OF NEEDS Skin   Limited Assist Verbally Normal                       Personal Care Assistance Level of Assistance  Bathing, Feeding, Dressing Bathing Assistance: Limited assistance Feeding assistance: Limited assistance Dressing Assistance: Limited assistance     Functional Limitations Info             SPECIAL CARE FACTORS FREQUENCY  PT (By licensed PT), OT (By licensed OT)     PT Frequency: 2-3 times per week OT Frequency: 2-3 times per week            Contractures Contractures Info: Not present    Additional Factors Info  Code Status, Allergies Code Status Info: DNR Allergies Info: NKA           Current Medications (08/10/2019):  This is the current hospital active medication list Current Facility-Administered Medications  Medication Dose Route Frequency Provider Last Rate Last Dose  . acetaminophen (TYLENOL) tablet 650 mg  650 mg Oral Q6H PRN 13/08/2019, MD       Or  . acetaminophen (TYLENOL) suppository 650 mg  650 mg Rectal Q6H PRN Lurene Shadow, MD      . labetalol (NORMODYNE) injection 10 mg  10 mg Intravenous Q6H PRN [place], Lurene Shadow, MD      .  MEDLINE mouth rinse  15 mL Mouth Rinse BID Jennye Boroughs, MD      . metoprolol tartrate (LOPRESSOR) injection 5 mg  5 mg Intravenous Q6H PRN [place], Suann Larry, MD      . morphine 2 MG/ML injection 2 mg  2 mg Intravenous Q6H PRN Jennye Boroughs, MD   2 mg at 08/10/19 1508  . naloxone [facility]) injection 0.4 mg  0.4 mg Intravenous Once Earleen Newport, MD      . ondansetron [facility]) tablet 4 mg  4 mg Oral Q6H PRN Danford, Suann Larry, MD       Or  .  ondansetron (ZOFRAN) injection 4 mg  4 mg Intravenous Q6H PRN Danford, Suann Larry, MD      . oxyCODONE (Oxy IR/ROXICODONE) immediate release tablet 5 mg  5 mg Oral Q6H PRN Jennye Boroughs, MD      . ramipril (ALTACE) capsule 5 mg  5 mg Oral Daily Jennye Boroughs, MD         Discharge Medications: Please see discharge summary for a list of discharge medications.  Relevant Imaging Results:  Relevant Lab Results:   Additional Information SS# [id number]  Shelbie Hutching, RN

## 2019-08-10 NOTE — Procedures (Signed)
Interventional Radiology Procedure:   Indications: Right pleural effusion.  Procedure: US guided thoracentesis  Findings: Removed 700 ml from right chest.  Complications: None     EBL: Less than 10 ml  Plan: Follow up CXR   Natane Heward R. Jayonna Meyering, MD  Pager: [phone]        

## 2019-08-10 NOTE — Evaluation (Signed)
Occupational Therapy Evaluation Patient Details Name: John Rodgers MRN: [id number] DOB: [birth year] Today's Date: 08/10/2019    History of Present Illness Pt admitted for subdural hematoma with acute subdural hemorrhage, stable through repeat CT. History includes dementia, CVA with L side weakness, CAD, HTN, and seizures. Pt also legally blind describes as tunnel vision.   Clinical Impression   Patient seen for OT evaluation this date.  Patient reports he lives at [facility] and has been there for a long time.  He reports he was independent prior to admission and has a rollator.  He is a poor historian and demonstrates memory impairments therefore it is uncertain what his true prior level of function.  Pt. presents with muscle weakness, decreased coordination, decreased transfers, decreased functional mobility, impaired cognition/memory, and decreased ability to perform basic self care tasks.  He would benefit from skilled OT to maximize safety and independence in necessary daily tasks.      Follow Up Recommendations  SNF    Equipment Recommendations       Recommendations for Other Services       Precautions / Restrictions Precautions Precautions: Fall Restrictions Weight Bearing Restrictions: No      Mobility Bed Mobility Overal bed mobility: Needs Assistance Bed Mobility: Supine to Sit;Rolling Rolling: +2 for physical assistance;Mod assist   Supine to sit: Mod assist;+2 for physical assistance     Transfers Overall transfer level: Needs assistance Equipment used: Rolling walker (2 wheeled) Transfers: Sit to/from Stand Sit to Stand: Min assist;+2 physical assistance         General transfer comment: able to stand from standard height bed, needs +2 assist for upright posture. Pt tends to try to sit back down, however then able to stand upright.    Balance Overall balance assessment: Needs assistance;History of Falls Sitting-balance support: Feet  supported;Bilateral upper extremity supported Sitting balance-Leahy Scale: Fair     Standing balance support: Bilateral upper extremity supported Standing balance-Leahy Scale: Fair                             ADL either performed or assessed with clinical judgement   ADL Overall ADL's : Needs assistance/impaired Eating/Feeding: Set up   Grooming: Set up;Minimal assistance Grooming Details (indicate cue type and reason): min assist and cues for washing face and hands. Upper Body Bathing: Minimal assistance   Lower Body Bathing: Maximal assistance   Upper Body Dressing : Minimal assistance   Lower Body Dressing: Maximal assistance   Toilet Transfer: +2 for physical assistance;Minimal assistance Toilet Transfer Details (indicate cue type and reason): per clinical judgement           General ADL Comments: Patient requiring increased assist to perform functional transfers, toileting, basic self care tasks.     Vision   Additional Comments: Patient reports he has tunnel vision and appears to have difficulty with detecting items in peripheral field of vision.  He reports he wears glasses all the time but has no glasses at the moment.     Perception     Praxis      Pertinent Vitals/Pain Pain Assessment: 0-10 Pain Score: 10-Worst pain ever Pain Location: initially stating he had no pain however then reported 10/10 pain in his bottom.  Nursing in and assisted to reposition patient. Pain Descriptors / Indicators: Aching     Hand Dominance Right   Extremity/Trunk Assessment Upper Extremity Assessment Upper Extremity Assessment: Generalized weakness;LUE deficits/detail LUE Deficits /  Details: Patient with edema in left elbow at evaluation, some shakiness in left arm when moving and reports this is new.  RUE 5/5 strength, LUE 4/5 overall. LUE Sensation: WNL LUE Coordination: decreased fine motor   Lower Extremity Assessment Lower Extremity Assessment: Defer to  PT evaluation       Communication Communication Communication: No difficulties   Cognition Arousal/Alertness: Awake/alert Behavior During Therapy: WFL for tasks assessed/performed Overall Cognitive Status: History of cognitive impairments - at baseline    Patient states year is 2017 and president is Obama.  He states his age as 30 (he is 7).  He is aware this is a hospital but states he has never been here before and is unsure of the name of the facility.                                  General Comments       Exercises   Shoulder Instructions      Home Living Family/patient expects to be discharged to:: Skilled nursing facility                             Home Equipment: Walker - 4 wheels   Additional Comments: per chart review, pt currently living at [facility].      Prior Functioning/Environment Level of Independence: Needs assistance    ADL's / Homemaking Assistance Needed: Patient is a poor historian but reports he was able to perform his basic self care tasks prior to admission.   Comments: Patient confused and stating he went to lunch today with his son for margaritas.  Reports he lives at [facility] and has been there for a long time.  He reports using a rollator for ambulation.        OT Problem List: Decreased strength;Impaired vision/perception;Decreased knowledge of use of DME or AE;Increased edema;Decreased range of motion;Decreased coordination;Decreased activity tolerance;Decreased cognition;Impaired UE functional use;Impaired balance (sitting and/or standing);Decreased safety awareness;Pain      OT Treatment/Interventions: Self-care/ADL training;Visual/perceptual remediation/compensation;Therapeutic exercise;Patient/family education;Neuromuscular education;Balance training;Therapeutic activities;DME and/or AE instruction;Cognitive remediation/compensation    OT Goals(Current goals can be found in the care plan section)  Acute Rehab OT Goals Patient Stated Goal: Patient reports he would like to walk again, go back to [facility] and be able to dance "shag" OT Goal Formulation: With patient Time For Goal Achievement: 08/19/19 Potential to Achieve Goals: Fair ADL Goals Pt Will Perform Upper Body Dressing: with set-up Pt Will Perform Lower Body Dressing: with min assist Pt Will Transfer to Toilet: with min assist  OT Frequency: Min 1X/week   Barriers to D/C:            Co-evaluation              AM-PAC OT "6 Clicks" Daily Activity     Outcome Measure Help from another person eating meals?: None Help from another person taking care of personal grooming?: A Little Help from another person toileting, which includes using toliet, bedpan, or urinal?: A Lot Help from another person bathing (including washing, rinsing, drying)?: A Lot Help from another person to put on and taking off regular upper body clothing?: A Little Help from another person to put on and taking off regular lower body clothing?: A Lot 6 Click Score: 16   End of Session    Activity Tolerance: Patient tolerated treatment well Patient left: in bed;with  bed alarm set  OT Visit Diagnosis: Unsteadiness on feet (R26.81);Muscle weakness (generalized) (M62.81);Cognitive communication deficit (R41.841);Pain Pain - part of body: (buttocks)                Time: 6195-0932 OT Time Calculation (min): 24 min Charges:  OT General Charges $OT Visit: 1 Visit OT Evaluation $OT Eval Low Complexity: 1 Low OT Treatments $Self Care/Home Management : 8-22 mins  John Rodgers, OTR/L, CLT   John Rodgers 08/10/2019, 3:44 PM

## 2019-08-10 NOTE — Progress Notes (Signed)
Daily Progress Note   Patient Name: John Rodgers       Date: 08/10/2019 DOB: 11/14/1945  Age: 73 y.o. MRN#: [id number] Attending Physician: Jennye Boroughs, MD Primary Care Physician: Juluis Pitch, MD Admit Date: 08/07/2019  Reason for Consultation/Follow-up: Establishing goals of care  Subjective: Patient awake, alert, pleasantly confused but able to participate in discussion and answer questions. He recalls many long-term memories including his favorite authors. Patient worked well with PT this morning and is sitting comfortably in the chair.   GOC:  F/u with nephew/POA Mitzi Hansen) in family waiting room.   Discussed course of hospitalization including diagnoses, interventions, plan of care. Discussed that cognitively he will wax/wane moving forward with severe, irreversible brain damage and acute SDH. Discussed functional and nutritional status really playing a part if prognosis and how well he does moving forward. Surprisingly today, he is awake and working well with physical therapy.   Mitzi Hansen has reviewed MOST form and ready to complete. Decisions for his uncle include DNR/DNI, comfort focused care, IVF/ABX if indicated, and NO feeding tube. Durable DNR completed. Copies of MOST and durable DNR placed in chart and given to nephew.  Initially, Mitzi Hansen was considering hospice services at [facility]. Discussed hospice philosophy and options. After seeing his uncle stand up with physical therapy and get to chair, he is hopeful that maybe Dedrick will be able to work with therapy at [facility].   Discussed outpatient palliative referral at SNF and this can easily transition to hospice services/comfort measures if/when his uncle declines. Mitzi Hansen agrees with this plan.   Answered questions and concerns. PMT  contact information given.   Length of Stay: 3  Current Medications: Scheduled Meds:  . mouth rinse  15 mL Mouth Rinse BID  . naLOXone (NARCAN)  injection  0.4 mg Intravenous Once  . ramipril  5 mg Oral Daily    Continuous Infusions:  PRN Meds: acetaminophen **OR** acetaminophen, labetalol, metoprolol tartrate, morphine injection, ondansetron **OR** ondansetron (ZOFRAN) IV, oxyCODONE  Physical Exam Vitals signs and nursing note reviewed.  Constitutional:      General: He is awake.  HENT:     Head: Normocephalic and atraumatic.  Pulmonary:     Effort: No tachypnea, accessory muscle usage or respiratory distress.  Skin:    General: Skin is warm and dry.  Neurological:  Mental Status: He is alert.     Comments: Oriented to self/place, pleasantly confused with baseline dementia. Answering simple questions and participating with therapy.   Psychiatric:        Mood and Affect: Mood normal.        Speech: Speech normal.        Behavior: Behavior normal.        Cognition and Memory: Cognition is impaired.            Vital Signs: BP 136/67 (BP Location: Right Arm)   Pulse 86   Temp 98.2 F (36.8 C) (Oral)   Resp 17   Ht 5\' 10"  (1.778 m)   Wt 79.4 kg   SpO2 100%   BMI 25.11 kg/m  SpO2: SpO2: 100 % O2 Device: O2 Device: Nasal Cannula O2 Flow Rate: O2 Flow Rate (L/min): 2 L/min  Intake/output summary:   Intake/Output Summary (Last 24 hours) at 08/10/2019 1604 Last data filed at 08/10/2019 1124 Gross per 24 hour  Intake 2420.58 ml  Output 300 ml  Net 2120.58 ml   LBM: Last BM Date: 08/10/19 Baseline Weight: Weight: 79.4 kg Most recent weight: Weight: 79.4 kg       Palliative Assessment/Data: PPS 40%    Flowsheet Rows     Most Recent Value  Intake Tab  Referral Department  Hospitalist  Unit at Time of Referral  Med/Surg Unit  Palliative Care Primary Diagnosis  Neurology  Palliative Care Type  New Palliative care  Reason for referral  Clarify Goals of  Care  Date first seen by Palliative Care  08/09/19  Clinical Assessment  Palliative Performance Scale Score  40%  Psychosocial & Spiritual Assessment  Palliative Care Outcomes  Patient/Family meeting held?  Yes  Who was at the meeting?  nephew  Palliative Care Outcomes  Clarified goals of care, Counseled regarding hospice, Provided end of life care assistance, Provided advance care planning, Provided psychosocial or spiritual support, Completed durable DNR, Linked to palliative care logitudinal support, ACP counseling assistance      Patient Active Problem List   Diagnosis Date Noted  . Palliative care by specialist   . Altered mental status   . Pleural effusion   . Pressure injury of skin 08/08/2019  . Subdural hematoma (HCC) 08/07/2019  . Chronic diastolic CHF (congestive heart failure) (HCC) 08/07/2019  . Hyponatremia 08/07/2019  . Hypokalemia 08/07/2019  . History of seizures 08/07/2019  . SOB (shortness of breath) 05/11/2019  . Anticoagulated on warfarin 02/15/2018  . Carotid stenosis 01/17/2018  . Atherosclerosis of native arteries of extremity with intermittent claudication (HCC) 07/12/2017  . Other mechanical complication of femoral arterial graft (bypass), sequela 05/07/2017  . Leg pain 04/29/2017  . DJD (degenerative joint disease), lumbosacral 04/29/2017  . Peripheral artery disease (HCC) 03/23/2017  . Essential hypertension 03/23/2017  . Hyperlipidemia 03/23/2017  . Vascular dementia without behavioral disturbance (HCC) 07/29/2016  . Acute CHF (congestive heart failure) (HCC) 05/04/2016  . Disturbances of vision, late effect of stroke 06/25/2014  . Unspecified sequelae of cerebral infarction 04/24/2014  . Goals of care, counseling/discussion 06/13/2013  . Permanent atrial fibrillation (HCC) 09/02/2011  . Cardiac pacemaker in situ 08/10/2011  . Syncope 08/10/2011  . 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  male  with past medical history of dementia, CVA left sided weakness, legally blind, diastolic CHF, afib on warfarin, pacemaker, CAD, HTN, seizures admitted on 08/07/2019 with weakness, fall, hypoxia. On  10/19, patient tested positive for covid, asymptomatic besides weakness. 11/5 patient experienced a fall seen in ER, CT head negative. This admission, CT head showed severe old encephalomalacia and new right occipital ADH without mass effect. Neurosurgery recommended non-operative management. Repeat CT at 6 hours with unchanged hemorrhage. Palliative medicine consultation for goals of care.   Assessment: Acute subdural hematoma Encephalopathy Dementia Right-sided pleural effusion Atrial fibrillation Hx of diastolic CHF Hx of CVA  Recommendations/Plan:  MOST form completed with nephew/POA. DNR/DNI, comfort focused care, IVF/ABX if indicated, and NO feeding tube. Durable DNR completed. Copies of MOST and durable placed in chart and given to nephew.   Continue medical management including PT/OT attempts.   Plan for thoracentesis today.  Outpatient palliative referral at SNF. Nephew understands this can transition to hospice services if patient declines quickly in the near future.   Code Status: DNR/DNI   Code Status Orders  (From admission, onward)         Start     Ordered   08/07/19 1642  Do not attempt resuscitation (DNR)  Continuous    Question Answer Comment  In the event of cardiac or respiratory ARREST Do not call a "code blue"   In the event of cardiac or respiratory ARREST Do not perform Intubation, CPR, defibrillation or ACLS   In the event of cardiac or respiratory ARREST Use medication by any route, position, wound care, and other measures to relive pain and suffering. May use oxygen, suction and manual treatment of airway obstruction as needed for comfort.      08/07/19 1641        Code Status History    Date Active Date Inactive Code Status Order ID Comments User  Context   05/11/2017 1251 05/11/2017 1722 Full Code [id number]  Schnier, Latina Craver, MD Inpatient   Advance Care Planning Activity       Prognosis:   Unable to determine: guarded  Discharge Planning:  Back to LTC facility with rehab/outpatient palliative referral  Care plan was discussed with patient, Greig Castilla (nephew), RN, PT, RN CM  Thank you for allowing the Palliative Medicine Team to assist in the care of this patient.   Time In: 1000 Time Out: 1100 Total Time 60 Prolonged Time Billed  no      Greater than 50%  of this time was spent counseling and coordinating care related to the above assessment and plan.  Vennie Homans, DNP, FNP-C Palliative Medicine Team  Phone: [phone] Fax: [phone]  Please contact Palliative Medicine Team phone at [phone] for questions and concerns.

## 2019-08-11 DIAGNOSIS — Z9889 Other specified postprocedural states: Secondary | ICD-10-CM

## 2019-08-11 DIAGNOSIS — R4182 Altered mental status, unspecified: Secondary | ICD-10-CM

## 2019-08-11 NOTE — TOC Transition Note (Signed)
Transition of Care [facility]) - CM/SW Discharge Note   Patient Details  Name: John Rodgers MRN: [id number] Date of Birth: Jan 26, 1946  [facility]) CM/SW Contact:  Su Hilt, RN Phone Number: 08/11/2019, 12:21 PM   Clinical Narrative:    Patient to Discharge back to [facility] today room 105A,  The bedside nurse to call report And to call EMS to transport when ready  Nephew was called and he is aware of the patient going back and is agreeable   Final next level of care: Skilled Nursing Facility Barriers to Discharge: Barriers Resolved   Patient Goals and CMS Choice Patient states their goals for this hospitalization and ongoing recovery are:: Just wants to have the food he wants- and he would like to walk      Discharge Placement                       Discharge Plan and Services   Discharge Planning Services: CM Consult                                 Social Determinants of Health (SDOH) Interventions     Readmission Risk Interventions No flowsheet data found.

## 2019-08-11 NOTE — Progress Notes (Signed)
Patient discharged via EMS. Shaylee Stanislawski S, RN  

## 2019-08-11 NOTE — Progress Notes (Signed)
EMS called for transport to [facility]. Madlyn Frankel, RN

## 2019-08-11 NOTE — Progress Notes (Signed)
Report given to [facility]. Madlyn Frankel, RN

## 2019-08-11 NOTE — Care Management Important Message (Signed)
Important Message  Patient Details  Name: John Rodgers MRN: [id number] Date of Birth: [birth year]   Medicare Important Message Given:  Yes     [name] 08/11/2019, 11:27 AM

## 2019-08-11 NOTE — Progress Notes (Signed)
SLP Note  Patient Details Name: John Rodgers MRN: [id number] DOB: 09-25-1946   Cancelled treatment:       Reason Eval/Treat Not Completed: (chart reviewed; NSG consulted ST services re: diet). NSG reported pt's oral diet had been re-initiated by MD this morning, but a Regular consistency diet. During Speech Therapy's BSE, it was recommended that pt be on a Dysphagia diet d/t apparent oropharyngeal phase dysphagia and increased risk for aspiration. NSG agreed w/ concern and previous diet recommendations. This SLP changed the diet to the recommended consistency from the BSE: Dysphagia level 1 (puree) w/ Honey consistency liquids; aspiration precautions. Noted also that pt was being prepared for discharge to SNF today. Recommend ST f/u at SNF for ongoing assessment of pt's swallowing function and safety w/ a least restrictive diet consistency -- pt's stated goals per chart notes are "Just want to have the food I want; and would like to walk again". Noted Hospice is following pt at SNF.    Orinda Kenner, MS, CCC-SLP Mazikeen Hehn 08/11/2019, 3:19 PM

## 2019-08-11 NOTE — TOC Transition Note (Signed)
Transition of Care [facility]) - CM/SW Discharge Note   Patient Details  Name: John Rodgers MRN: [id number] Date of Birth: 1946/07/16  [facility]) CM/SW Contact:  Tieisha Darden, Lenice Llamas Phone Number: [phone]  08/11/2019, 2:13 PM   Clinical Narrative: [place] [facility] liaison is aware that patient will have outpatient palliative care at [facility].       Final next level of care: Skilled Nursing Facility Barriers to Discharge: Barriers Resolved   Patient Goals and CMS Choice Patient states their goals for this hospitalization and ongoing recovery are:: Just wants to have the food he wants- and he would like to walk      Discharge Placement                       Discharge Plan and Services   Discharge Planning Services: CM Consult                                 Social Determinants of Health (SDOH) Interventions     Readmission Risk Interventions No flowsheet data found.

## 2019-08-11 NOTE — Discharge Summary (Addendum)
Physician Discharge Summary  John Rodgers YIF:[id number] DOB: 02/07/1946 DOA: 08/07/2019  PCP: Dorothey Baseman, MD  Admit date: 08/07/2019 Discharge date: 08/11/2019  Discharge disposition: Skilled nursing facility   Recommendations for Outpatient Follow-Up:   1. Outpatient follow-up with palliative care team is recommended  2.  Follow-up with patient at the nursing home within 3 days of discharge   Discharge Diagnosis:   Principal Problem:   SDH (subdural hematoma) (HCC) Active Problems:   Peripheral artery disease (HCC)   Essential hypertension   Cardiac pacemaker in situ   Goals of care, counseling/discussion   Permanent atrial fibrillation (HCC)   Vascular dementia without behavioral disturbance (HCC)   Chronic diastolic CHF (congestive heart failure) (HCC)   Hyponatremia   Hypokalemia   History of seizures   Pressure injury of skin   Palliative care by specialist   Altered mental status   Pleural effusion on right   Status post thoracentesis    Discharge Condition: Stable.  Diet recommendation: Low sodium, heart healthy, pureed diet, honey thick liquids with assistance  Code status: DNR    Hospital Course:   John Rodgers is an 73 y.o. male with history significant for dementia, blindness, hearing impairment, stroke, diastolic CHF, atrial fibrillation on warfarin, recent COVID-19 infection, permanent pacemaker in place, CAD, seizures, hypertension, venous insufficiency ulcers.  He presented to the hospital with generalized weakness, hypoxia, altered mental status.  He was found to have acute subdural hematoma with lethargy. He was on warfarin prior to admission and his INR was elevated when he was given IV vitamin K and prothrombin complex concentrate for Coumadin coagulopathy. He was seen in consultation by the neurosurgeon and he was deemed not to be a good surgical candidate.  Repeat CT scan showed that the subdural hematoma was stable. He underwent  right-sided thoracentesis for right pleural effusion on 08/10/2019 and pleural fluid was transudate. His mental status has improved and is back to baseline and he generally has some confusion at baseline because of dementia.        Medical Consultants:    Dr. Lucy Chris, neurosurgeon   Discharge Exam:   Vitals:   08/11/19 0351 08/11/19 0355  BP: (!) 127/52 (!) 127/52  Pulse: 87 87  Resp: 18 18  Temp: 99 F (37.2 C) 99 F (37.2 C)  SpO2: 97%    Vitals:   08/10/19 1623 08/10/19 1900 08/11/19 0351 08/11/19 0355  BP: 119/86 (!) 124/58 (!) 127/52 (!) 127/52  Pulse: 91 78 87 87  Resp:   18 18  Temp:  97.7 F (36.5 C) 99 F (37.2 C) 99 F (37.2 C)  TempSrc:  Oral Oral Oral  SpO2: 100% 98% 97%   Weight:      Height:         GEN: NAD SKIN: No rash EYES: PERRLA ENT: MMM CV: RRR PULM: CTA B ABD: soft, ND, NT, +BS CNS: AAO x 2, left-sided weakness EXT: No edema or tenderness   The results of significant diagnostics from this hospitalization (including imaging, microbiology, ancillary and laboratory) are listed below for reference.     Procedures and Diagnostic Studies:   Ct Head Wo Contrast  Result Date: 08/07/2019 CLINICAL DATA:  Follow-up of subdural hemorrhage EXAM: CT HEAD WITHOUT CONTRAST TECHNIQUE: Contiguous axial images were obtained from the base of the skull through the vertex without intravenous contrast. COMPARISON:  August 07, 2019 at 10:34 a.m. FINDINGS: Brain: Again noted is a large area of encephalomalacia involving the right parietooccipital  lobe and left occipital lobe. The acute subdural hemorrhage over the posterior falx and overlying the entire right convexity is unchanged in size and appearance from the prior exam. This is most notable is along the posterior falx and right occipital lobe. This measures approximately 6 mm in transverse dimension over the posterior falx and a maximum diameter over the posterior right occipital lobe of  approximately 6.5 mm. No new extra-axial collections are seen. No midline shift is noted. Again noted is ex vacuo dilatation of the posterior horn of the right lateral ventricle. The ventricles are otherwise stable in appearance. Vascular: No hyperdense vessel or unexpected calcification. Again noted is dense internal carotid artery calcifications. Skull: The skull is intact. No fracture or focal lesion identified. Sinuses/Orbits: The visualized paranasal sinuses and mastoid air cells are clear. The orbits and globes intact. Other: None IMPRESSION: 1. Subdural hemorrhage overlying the entire right convexity and posterior falx is unchanged in appearance and size from the prior exam. No new hemorrhage. 2. Stable areas of encephalomalacia involving the right parietooccipital lobe and left occipital lobe. Electronically Signed   By: Prudencio Pair M.D.   On: 08/07/2019 19:26   Ct Head Wo Contrast  Result Date: 08/07/2019 CLINICAL DATA:  Weakness.  Altered mental status. EXAM: CT HEAD WITHOUT CONTRAST TECHNIQUE: Contiguous axial images were obtained from the base of the skull through the vertex without intravenous contrast. COMPARISON:  Head CT scan 08/03/2019. FINDINGS: Brain: Large, remote right temporo-occipital infarct is again seen. The patient has acute subdural hemorrhage over the right convexities, right tentorium and overlying encephalomalacia in the right occipital and temporal lobes. There is also some subdural hemorrhage along the posterior aspect of the falx on the right. No midline shift or hydrocephalus. No evidence of acute infarct. Vascular: Extensive atherosclerosis.  Otherwise negative. Skull: Intact.  No focal lesion. Sinuses/Orbits: Status post cataract surgery.  Otherwise negative. Other: None. IMPRESSION: This examination is positive for acute subdural hemorrhage over the right convexities. The largest volume of hemorrhage overlies encephalomalacia in the right occipital and temporal lobes where  the patient has a remote infarct. Negative for midline shift or hydrocephalus. Critical Value/emergent results were called by telephone at the time of interpretation on 08/07/2019 at 11:13 am to [facility] , who verbally acknowledged these results. Electronically Signed   By: Inge Rise M.D.   On: 08/07/2019 11:17   Dg Chest Port 1 View  Result Date: 08/07/2019 CLINICAL DATA:  Weakness. EXAM: PORTABLE CHEST 1 VIEW COMPARISON:  Single-view of the chest 08/03/2019 FINDINGS: Hazy opacity of the right chest is consistent with a moderate to moderately large layering pleural effusion. There is some right basilar atelectasis. The left lung is clear. Heart size is enlarged. No pneumothorax. Pacing device is in place. IMPRESSION: Moderate to moderately large right pleural effusion with compressive atelectasis in the left lung base. Cardiomegaly without edema. Electronically Signed   By: Inge Rise M.D.   On: 08/07/2019 09:35     Labs:   Basic Metabolic Panel: Recent Labs  Lab 08/07/19 0931 08/08/19 0605 08/10/19 1659  NA 130* 135 139  K 3.1* 3.5 4.1  CL 85* 100 106  CO2 32 26 23  GLUCOSE 111* 137* 101*  BUN 32* 24* 17  CREATININE 0.84 0.67 0.71  CALCIUM 9.0 8.1* 8.8*   GFR Estimated Creatinine Clearance: 84.9 mL/min (by C-G formula based on SCr of 0.71 mg/dL). Liver Function Tests: Recent Labs  Lab 08/07/19 0931 08/08/19 0605  AST 43* 71*  ALT 21 24  ALKPHOS 91 80  BILITOT 2.2* 1.7*  PROT 7.5 6.5  ALBUMIN 3.5 3.2*   No results for input(s): LIPASE, AMYLASE in the last 168 hours. No results for input(s): AMMONIA in the last 168 hours. Coagulation profile Recent Labs  Lab 08/08/19 1042 08/08/19 1421 08/08/19 1755 08/09/19 0331 08/10/19 0315  INR 1.2 1.2 1.2 1.2 1.2    CBC: Recent Labs  Lab 08/07/19 0931 08/08/19 0605 08/10/19 1659  WBC 7.3 6.2 6.2  NEUTROABS 6.2  --  5.3  HGB 12.4* 10.8* 10.2*  HCT 36.5* 32.5* 31.8*  MCV 76.4* 77.6* 81.1    PLT 139* 165 167   Cardiac Enzymes: No results for input(s): CKTOTAL, CKMB, CKMBINDEX, TROPONINI in the last 168 hours. BNP: Invalid input(s): POCBNP CBG: No results for input(s): GLUCAP in the last 168 hours. D-Dimer No results for input(s): DDIMER in the last 72 hours. Hgb A1c No results for input(s): HGBA1C in the last 72 hours. Lipid Profile No results for input(s): CHOL, HDL, LDLCALC, TRIG, CHOLHDL, LDLDIRECT in the last 72 hours. Thyroid function studies No results for input(s): TSH, T4TOTAL, T3FREE, THYROIDAB in the last 72 hours.  Invalid input(s): FREET3 Anemia work up No results for input(s): VITAMINB12, FOLATE, FERRITIN, TIBC, IRON, RETICCTPCT in the last 72 hours. Microbiology Recent Results (from the past 240 hour(s))  Blood Culture (routine x 2)     Status: None (Preliminary result)   Collection Time: 08/07/19  9:29 AM   Specimen: BLOOD  Result Value Ref Range Status   Specimen Description BLOOD LEFT ANTECUBITAL  Final   Special Requests   Final    BOTTLES DRAWN AEROBIC AND ANAEROBIC Blood Culture adequate volume   Culture   Final    NO GROWTH 4 DAYS Performed at [facility], [address], [place], [place] [address]    Report Status PENDING  Incomplete  Blood Culture (routine x 2)     Status: None (Preliminary result)   Collection Time: 08/07/19  9:33 AM   Specimen: BLOOD  Result Value Ref Range Status   Specimen Description BLOOD BLOOD LEFT FOREARM  Final   Special Requests   Final    BOTTLES DRAWN AEROBIC AND ANAEROBIC Blood Culture adequate volume   Culture   Final    NO GROWTH 4 DAYS Performed at [facility], [address] [place], [place] [address]    Report Status PENDING  Incomplete  MRSA PCR Screening     Status: None   Collection Time: 08/08/19  4:47 PM   Specimen: Nasal Mucosa; Nasopharyngeal  Result Value Ref Range Status   MRSA by PCR NEGATIVE NEGATIVE Final    Comment:        The GeneXpert MRSA Assay  (FDA approved for NASAL specimens only), is one component of a comprehensive MRSA colonization surveillance program. It is not intended to diagnose MRSA infection nor to guide or monitor treatment for MRSA infections. Performed at [facility], [address] [place], [place] [address]   Body fluid culture     Status: None (Preliminary result)   Collection Time: 08/10/19  4:25 PM   Specimen: PATH Cytology Pleural fluid  Result Value Ref Range Status   Specimen Description   Final    PLEURAL Performed at [facility], [address] [place], [place] [address]    Special Requests   Final    NONE Performed at [facility], [address] [place], [place] [address]  Gram Stain PENDING  Incomplete   Culture   Final    NO GROWTH < 24 HOURS Performed at [facility], [address] [place], [place] [id number]    Report Status PENDING  Incomplete     Discharge Instructions:   Discharge Instructions    Amb Referral to Palliative Care   Complete by: As directed    Diet - low sodium heart healthy   Complete by: As directed    Pureed diet, honey thick liquids and requires assistance   Discharge instructions   Complete by: As directed    Follow up with physician at the nursing home within 3 days of discharge   Increase activity slowly   Complete by: As directed      Allergies as of 08/11/2019   No Known Allergies     Medication List    STOP taking these medications   aspirin EC 81 MG tablet   HYDROcodone-acetaminophen 5-325 MG tablet Commonly known as: NORCO/VICODIN     TAKE these medications   acetaminophen 325 MG tablet Commonly known as: TYLENOL Take 650 mg by mouth every 6 (six) hours as needed for mild pain.   calcium gluconate 500 MG tablet Take 500 mg by mouth 2 (two) times daily.   donepezil 10 MG tablet Commonly known as: ARICEPT Take 10 mg by mouth daily.   furosemide 20 MG tablet Commonly known as:  LASIX Take 20 mg by mouth daily.   hydroxypropyl methylcellulose / hypromellose 2.5 % ophthalmic solution Commonly known as: ISOPTO TEARS / GONIOVISC Place 2 drops into both eyes daily as needed for dry eyes.   loperamide 2 MG tablet Commonly known as: IMODIUM A-D Take 4 mg by mouth See admin instructions. Give 2 tablets after first loose stool and 1 tablet after each consecutive stool   memantine 10 MG tablet Commonly known as: NAMENDA Take 10 mg by mouth 2 (two) times daily.   metolazone 2.5 MG tablet Commonly known as: ZAROXOLYN Take 2.5 mg by mouth daily.   metoprolol tartrate 25 MG tablet Commonly known as: LOPRESSOR Take 37.5 mg by mouth 2 (two) times daily.   multivitamin with minerals Tabs tablet Take 1 tablet by mouth daily.   ramipril 5 MG capsule Commonly known as: ALTACE Take 5 mg by mouth daily.   simvastatin 10 MG tablet Commonly known as: ZOCOR Take 10 mg by mouth at bedtime.   tamsulosin 0.4 MG Caps capsule Commonly known as: FLOMAX Take 0.4 mg by mouth daily.   Vitamin D (Ergocalciferol) 1.25 MG (50000 UT) Caps capsule Commonly known as: DRISDOL Take 50,000 Units by mouth every 30 (thirty) days. (take on the 7th of the month)         Time coordinating discharge: 32 minutes  Signed:  Lurene ShadowBERNARD Jood Retana  Pager [phone] [facility] 08/11/2019, 11:40 AM

## 2019-08-12 LAB — CULTURE, BLOOD (ROUTINE X 2)
Culture: NO GROWTH
Culture: NO GROWTH
Special Requests: ADEQUATE
Special Requests: ADEQUATE

## 2019-08-14 ENCOUNTER — Other Ambulatory Visit (HOSPITAL_COMMUNITY): Payer: Self-pay | Admitting: Neurosurgery

## 2019-08-14 ENCOUNTER — Other Ambulatory Visit: Payer: Self-pay | Admitting: Neurosurgery

## 2019-08-14 DIAGNOSIS — S065XAA Traumatic subdural hemorrhage with loss of consciousness status unknown, initial encounter: Secondary | ICD-10-CM

## 2019-08-14 DIAGNOSIS — S065X9A Traumatic subdural hemorrhage with loss of consciousness of unspecified duration, initial encounter: Secondary | ICD-10-CM

## 2019-08-14 LAB — BODY FLUID CULTURE: Culture: NO GROWTH

## 2019-08-15 LAB — CYTOLOGY - NON PAP

## 2019-08-16 ENCOUNTER — Other Ambulatory Visit: Payer: Self-pay

## 2019-08-16 ENCOUNTER — Non-Acute Institutional Stay: Payer: Medicare Other | Admitting: Primary Care

## 2019-08-16 DIAGNOSIS — Z515 Encounter for palliative care: Secondary | ICD-10-CM

## 2019-08-16 NOTE — Progress Notes (Signed)
Therapist, nutritional Palliative Care Consult Note Telephone: [phone]  Fax: [phone]  TELEHEALTH VISIT STATEMENT Due to the COVID-19 crisis, this visit was done via telemedicine from my office. It was initiated and consented to by this patient and/or family.  PATIENT NAME: John Rodgers [facility] [address] [place] [address] [phone] (home)  DOB: 1946/08/26 MRN: [id number]  PRIMARY CARE PROVIDER:   Dorothey Baseman, MD, [address] [place] [address] [phone]  REFERRING PROVIDER:  Dorothey Baseman, MD [phone] [place],  [place] [address] [phone]  RESPONSIBLE PARTY:   Extended Emergency Contact Information Primary Emergency Contact: Calogero, Geisen Mobile Phone: [phone] Relation: Nephew   ASSESSMENT AND RECOMMENDATIONS:   1. Advance Care Planning/Goals of Care: Goals include to maximize quality of life and symptom management. MOST on file in [facility] outlines DNR, comfort measures, use of iv fluids and antibiotics, and no feeding tube.  I will review with POA.   2. Symptom Management:   Pain: He reports occasional pain treated with OTC prns.  Nutrition: Has lost 30 lbs in past several months, This is 15-20% weight loss although some weight may be edema. "Dry" weight seems to be around 180 lbs.   Function; Has decreased in ability to ambulate with a walker to not being able to be oob by himself. He is gotten up to chair with lift or remains in bed. Plan for PT, OT and ST to assess at [facility].  Hospice eligibility: Recommend repeating serum albumin in 2 weeks or after he completes therapy course. He has declined in his eating and lost 15% of weight, and has a stage 3 ulcer. I would recommend my re-assessment  after he finishes therapies, in 1-2 weeks, and refer at that time if he meets eligibility parameters and hospice is a goal of care.  3. Family /Caregiver/Community Supports: Currently resident of long term care. Has POA  who is nephew. I will reach out to discuss with POA by phone.  4. Cognitive / Functional decline: Has had a sudden and sharp decline in mentation and function. He is confused most of the time. He went from 100% intake to < 25 % in the last month and has lost 15% weight in several months. He recently had a covid infection. He has begun to fall frequently now.  5. Follow up Palliative Care Visit: Palliative care will continue to follow for goals of care clarification and symptom management. Return 2 weeks or prn.  I spent 35 minutes providing this consultation,  from 1300 to 1335. More than 50% of the time in this consultation was spent coordinating communication.   HISTORY OF PRESENT ILLNESS:  Llewellyn Choplin is a 73 y.o. year old male with multiple medical problems including recent covid +, arthritis, CHF, dementia, hyperlipidemia, hypertension, MI, peripheral vascular disease, seizures, recent altered mental status and pleural effusion in 11/20. Palliative Care was asked to follow this patient by consultation request of Dorothey Baseman, MD to help address advance care planning and goals of care. This is the initial visit.  CODE STATUS: MOST on file with DNR, comfort measures, antibiotics if needed, IV fluids if needed, no feeding tube.  PPS: 30% HOSPICE ELIGIBILITY/DIAGNOSIS: yes, after he finishes course of therapeutic interventions.   PAST MEDICAL HISTORY:  Past Medical History:  Diagnosis Date  . Arthritis   . CHF (congestive heart failure) (HCC)   . Coronary artery disease   . Dementia (HCC)    VASCULAR  . Dementia (  [place])   . Diarrhea    SEVERAL DAYS/ GIVEN MED TO STOP AT HOME HE LIVES IN  . Dysrhythmia    AFIB  . GERD (gastroesophageal reflux disease)   . Headache    MIGRAINE  . Heart murmur    SURGICAL REPAIR  . HOH (hard of hearing)   . Hypercholesterolemia   . Hypertension   . Myocardial infarction ([place])   . Neuromuscular disorder ([place])    MUSCLE WEAKNESS  . Osteoporosis    . Peripheral vascular disease ([place])   . Presence of permanent cardiac pacemaker   . Seizures ([place])    x1 years ago no longer on anti seizure medicines  . Stroke [facility]) 1996   more weakness left side CEREBELLAR STROKE SYNDROME  . Tunnel vision   . Vision loss    bil tunnel vision    SOCIAL HX:  Social History   Tobacco Use  . Smoking status: Former Research scientist (life sciences)  . Smokeless tobacco: Never Used  Substance Use Topics  . Alcohol use: No    ALLERGIES: No Known Allergies   PERTINENT MEDICATIONS:  Outpatient Encounter Medications as of 08/16/2019  Medication Sig  . acetaminophen (TYLENOL) 325 MG tablet Take 650 mg by mouth every 6 (six) hours as needed for mild pain.  . calcium gluconate 500 MG tablet Take 500 mg by mouth 2 (two) times daily.  Marland Kitchen donepezil (ARICEPT) 10 MG tablet Take 10 mg by mouth daily.   . furosemide (LASIX) 20 MG tablet Take 20 mg by mouth daily.   . hydroxypropyl methylcellulose / hypromellose (ISOPTO TEARS / GONIOVISC) 2.5 % ophthalmic solution Place 2 drops into both eyes daily as needed for dry eyes.  Marland Kitchen loperamide (IMODIUM A-D) 2 MG tablet Take 4 mg by mouth See admin instructions. Give 2 tablets after first loose stool and 1 tablet after each consecutive stool  . memantine (NAMENDA) 10 MG tablet Take 10 mg by mouth 2 (two) times daily.   . metolazone (ZAROXOLYN) 2.5 MG tablet Take 2.5 mg by mouth daily.  . metoprolol tartrate (LOPRESSOR) 25 MG tablet Take 37.5 mg by mouth 2 (two) times daily.   . Multiple Vitamin (MULTIVITAMIN WITH MINERALS) TABS tablet Take 1 tablet by mouth daily.  . ramipril (ALTACE) 5 MG capsule Take 5 mg by mouth daily.   . simvastatin (ZOCOR) 10 MG tablet Take 10 mg by mouth at bedtime.   . tamsulosin (FLOMAX) 0.4 MG CAPS capsule Take 0.4 mg by mouth daily.  . Vitamin D, Ergocalciferol, (DRISDOL) 50000 units CAPS capsule Take 50,000 Units by mouth every 30 (thirty) days. (take on the 7th of the month)   No facility-administered encounter  medications on file as of 08/16/2019.     PHYSICAL EXAM / ROS:   Current and past weights: 193 lbs including edema in 9/20, Then 181 lbs after diuresis, 6' ht. At hospital, 154 lb.currently, 15 % wt loss. General: NAD, frail appearing, thin, agitation increasing  HEENT: Dysphagia and aspiration history Cardiovascular: no chest pain reported, no edema,  Pulmonary: no cough, no increased SOB, covid + on 07/19/19, Oxygen per [facility] 3 L, since covid dx. Recent pleural effusion drawn off at hospital. Abdomen: appetite poor,puree diet,  endorses constipation, incontinent of bowel. Albumin 3.5- 3.2 g/dl  On 11/10 per hospital record. GU: denies dysuria, incontinent of urine MSK:  no joint deformities, non-ambulatory now, was walking with rollator, recent fall with facial bruising and on flank, abx therapies working with him.  Skin: no rashes or  wounds reported, current Stage3  pressure area on sacrum, cellulitis of LE earlier in fall Neurological: Weakness, h/o CVA and L weakness, Frequent falls recently  Marijo FileKathryn M Orry Sigl DNP AGPCNP-BC

## 2019-08-18 ENCOUNTER — Non-Acute Institutional Stay: Payer: Self-pay | Admitting: Primary Care

## 2019-08-23 ENCOUNTER — Ambulatory Visit: Payer: Medicare Other

## 2019-08-25 ENCOUNTER — Other Ambulatory Visit: Payer: Self-pay

## 2019-08-25 ENCOUNTER — Non-Acute Institutional Stay: Payer: Medicare Other | Admitting: Primary Care

## 2019-08-25 DIAGNOSIS — Z515 Encounter for palliative care: Secondary | ICD-10-CM

## 2019-08-25 DIAGNOSIS — E43 Unspecified severe protein-calorie malnutrition: Secondary | ICD-10-CM

## 2019-08-25 NOTE — Progress Notes (Signed)
Therapist, nutritional Palliative Care Consult Note Telephone: [phone]  Fax: [phone]  TELEHEALTH VISIT STATEMENT Due to the COVID-19 crisis, this visit was done via telemedicine from my office. It was initiated and consented to by this patient and/or family.  PATIENT NAME: John Rodgers [facility] [address] [place] [address] [phone] (home)  DOB: 1945-11-12 MRN: [id number]  PRIMARY CARE PROVIDER:   Dorothey Baseman, MD, [address] [place] [address] [phone]  REFERRING PROVIDER:  Dorothey Baseman, MD [phone] [place],  [place] [address] [phone]  RESPONSIBLE PARTY:   Extended Emergency Contact Information Primary Emergency Contact: Sigfredo, Schreier Mobile Phone: [phone] Relation: Nephew   ASSESSMENT AND RECOMMENDATIONS:   1. Advance Care Planning/Goals of Care: Goals include to maximize quality of life and symptom management. MOST on file in [facility] outlines DNR, comfort measures, use of iv fluids and antibiotics, and no feeding tube. Call to [facility] and left phone number and email address for contact. This is second attempt to contact.  2. Symptom Management:   Weight loss: 143 lbs currently on 08/25/2019. Historically, was  193 lbs including edema in 9/20, then 181 lbs after diuresis, 6' ht. This would be assumed to be his dry weight at baseline. At hospital, 154 lb in 08/02/2019, 20 % wt loss as of this writing. ST is involved and assessing. Recommend hospice admission once therapies are completed in light of chosen goals of comfort care.  Pain; Recommend ATC Acetaminophen 650 mg tid po  for back pain.  Nutrition:  Recommend total feeding assistance if not already in place. Complaining about food consistency, meaning he is wanting more texture. He has been upgraded to more solid foods in the last few days and I will continue to check weights to see if intake improves and weight loss stems. Otherwise he is likely in an end of  life weight loss cycle.   3. Family /Caregiver/Community Supports: Has nephew who is POA. I have reached out by phone to have discussion re care planning. Current LTC resident in [facility].  4. Cognitive / Functional decline: Alert, oriented x 1-2. Staff states his mentation has improved somewhat since incident earlier in the month. He is able to speak to me by telemedicine but he is [facility] and does not have in his hearing aids.  5. Follow up Palliative Care Visit: Palliative care will continue to follow for goals of care clarification and symptom management. Return 2-4 weeks or prn.  I spent 25 minutes providing this consultation,  from 1100 to 1125. More than 50% of the time in this consultation was spent coordinating communication.   HISTORY OF PRESENT ILLNESS:  John Rodgers is a 73 y.o. year old male with multiple medical problems including weight loss, back pain, recent covid +, arthritis, CHF, dementia, hyperlipidemia, hypertension, MI, peripheral vascular disease, seizures, recent altered mental status and pleural effusion in 11/20. Palliative Care was asked to follow this patient by consultation request of Dorothey Baseman, MD to help address advance care planning and goals of care. This is a follow up visit.  CODE STATUS:  DNR, comfort measures, use of iv fluids and antibiotics, and no feeding tube.  PPS: 30% HOSPICE ELIGIBILITY/DIAGNOSIS: yes if consistent with goals of care/protein calorie malnutrition.   PAST MEDICAL HISTORY:  Past Medical History:  Diagnosis Date  . Arthritis   . CHF (congestive heart failure) (HCC)   . Coronary artery disease   . Dementia (HCC)    VASCULAR  .  Dementia ([place])   . Diarrhea    SEVERAL DAYS/ GIVEN MED TO STOP AT HOME HE LIVES IN  . Dysrhythmia    AFIB  . GERD (gastroesophageal reflux disease)   . Headache    MIGRAINE  . Heart murmur    SURGICAL REPAIR  . HOH (hard of hearing)   . Hypercholesterolemia   . Hypertension   . Myocardial infarction  ([place])   . Neuromuscular disorder ([place])    MUSCLE WEAKNESS  . Osteoporosis   . Peripheral vascular disease ([place])   . Presence of permanent cardiac pacemaker   . Seizures ([place])    x1 years ago no longer on anti seizure medicines  . Stroke [facility]) 1996   more weakness left side CEREBELLAR STROKE SYNDROME  . Tunnel vision   . Vision loss    bil tunnel vision    SOCIAL HX:  Social History   Tobacco Use  . Smoking status: Former Research scientist (life sciences)  . Smokeless tobacco: Never Used  Substance Use Topics  . Alcohol use: No    ALLERGIES: No Known Allergies   PERTINENT MEDICATIONS:  Outpatient Encounter Medications as of 08/25/2019  Medication Sig  . acetaminophen (TYLENOL) 325 MG tablet Take 650 mg by mouth every 6 (six) hours as needed for mild pain.  . calcium gluconate 500 MG tablet Take 500 mg by mouth 2 (two) times daily.  Marland Kitchen donepezil (ARICEPT) 10 MG tablet Take 10 mg by mouth daily.   . furosemide (LASIX) 20 MG tablet Take 20 mg by mouth daily.   . hydroxypropyl methylcellulose / hypromellose (ISOPTO TEARS / GONIOVISC) 2.5 % ophthalmic solution Place 2 drops into both eyes daily as needed for dry eyes.  Marland Kitchen loperamide (IMODIUM A-D) 2 MG tablet Take 4 mg by mouth See admin instructions. Give 2 tablets after first loose stool and 1 tablet after each consecutive stool  . memantine (NAMENDA) 10 MG tablet Take 10 mg by mouth 2 (two) times daily.   . metolazone (ZAROXOLYN) 2.5 MG tablet Take 2.5 mg by mouth daily.  . metoprolol tartrate (LOPRESSOR) 25 MG tablet Take 37.5 mg by mouth 2 (two) times daily.   . Multiple Vitamin (MULTIVITAMIN WITH MINERALS) TABS tablet Take 1 tablet by mouth daily.  . ramipril (ALTACE) 5 MG capsule Take 5 mg by mouth daily.   . simvastatin (ZOCOR) 10 MG tablet Take 10 mg by mouth at bedtime.   . tamsulosin (FLOMAX) 0.4 MG CAPS capsule Take 0.4 mg by mouth daily.  . Vitamin D, Ergocalciferol, (DRISDOL) 50000 units CAPS capsule Take 50,000 Units by mouth every 30 (thirty)  days. (take on the 7th of the month)   No facility-administered encounter medications on file as of 08/25/2019.     PHYSICAL EXAM / ROS:   Current and past weights:  143 lbs currently. 193 lbs including edema in 9/20, Then 181 lbs after diuresis, 6' ht. At hospital, 154 lb in 08/02/2019, 20 % wt loss. General: NAD, frail appearing, thin, HOH Cardiovascular: no chest pain reported, no edema Pulmonary: no cough, no increased SOB, Oxygen at 3 L,  Abdomen: appetite improving now 50%, recent upgrade, denies constipation, continent of bowel GU: denies dysuria, incontinent of urine MSK:  no joint deformities, non ambulatory with assistance,  Pivot transfers,  no recent falls Skin: Stage 3 sacral wound, bruises resolved Neurological: Weakness, h/o CVA with L weakness  Jason Coop, NP

## 2019-09-08 ENCOUNTER — Other Ambulatory Visit: Payer: Self-pay

## 2019-09-08 ENCOUNTER — Non-Acute Institutional Stay: Payer: Medicare Other | Admitting: Primary Care

## 2019-09-08 DIAGNOSIS — Z515 Encounter for palliative care: Secondary | ICD-10-CM

## 2019-09-08 NOTE — Progress Notes (Signed)
Designer, jewellery Palliative Care Consult Note Telephone: [phone]  Fax: [phone]  TELEHEALTH VISIT STATEMENT Due to the COVID-19 crisis, this visit was done via telemedicine from my office. It was initiated and consented to by this patient and/or family.  PATIENT NAME: [facility] [place] [address] [phone] (home)  DOB: April 04, 1946 MRN: [id number]  PRIMARY CARE PROVIDER:   Juluis Pitch, MD, [address] [place] [address] [phone]  REFERRING PROVIDER:  Juluis Pitch, MD [phone] [place],  [place] [address] [phone]  RESPONSIBLE PARTY:   Extended Emergency Contact Information Primary Emergency Contact: Bernarr, [place] Mobile Phone: [phone] Relation: Nephew  [email or web address] preferred contact  ASSESSMENT AND RECOMMENDATIONS:   1. Advance Care Planning/Goals of Care: Goals include to maximize quality of life and symptom management.Patient is eligible for hospice now and now unable to make own decisions. I have sent email to SNF to recommend hospice, and will follow their protocols for contacting family for agreement.I spoke with patient's nephew Mr. John Rodgers who understood that his uncle's illness is at end stage. We discussed the process of inflammatory syndrome and weight loss. He states he would like hospice for his uncle for symptom management. He states he is already made funeral preparations.  2. Symptom Management:   Pain  Recommend scheduling tramadol 50 mg q 6 hrs. Has much pain and agitation. On tramadol prn but should be scheduled ATC for best pain management. I would recommend rapid escalation to morphine if he's not comfortable quickly on qid tramadol 50 mg within 72 hours.  Nutrition: Recently upgraded in diet but intake sporadic. Weight and albumin are falling precipitously due to disease process.   3. Family /Caregiver/Community Supports: Nephew is POA. We discussed  hospice as above. He state he has been prepared for this. Lives in [place] and recently returned to his familiar unit after quarantine.  4. Cognitive / Functional decline: Alert, oriented. Endorses pain. Unable to ambulate, unable to do most adls and all iadls.  5. Follow up Palliative Care Visit: Recommend hospice admission  I spent 50 minutes providing this consultation,  from 1500 to 1550. More than 50% of the time in this consultation was spent coordinating communication.   HISTORY OF PRESENT ILLNESS:  Bayard More is a 73 y.o. year old male with multiple medical problems including recent covid +, arthritis, CHF, dementia, hyperlipidemia, hypertension, MI, peripheral vascular disease, seizures, recent altered mental status and pleural effusion in 11/20. Palliative Care was asked to follow this patient by consultation request of Juluis Pitch, MD to help address advance care planning and goals of care. This is a follow up visit.  CODE STATUS: DNR, comfort measures, use of iv fluids and antibiotics, and no feeding tube.  PPS: 30% HOSPICE ELIGIBILITY/DIAGNOSIS: Yes/vascular dementia, protein calorie malnutrition, stage 4 sacral decubitus  PAST MEDICAL HISTORY:  Past Medical History:  Diagnosis Date  . Arthritis   . CHF (congestive heart failure) ([place])   . Coronary artery disease   . Dementia ([place])    VASCULAR  . Dementia ([place])   . Diarrhea    SEVERAL DAYS/ GIVEN MED TO STOP AT HOME HE LIVES IN  . Dysrhythmia    AFIB  . GERD (gastroesophageal reflux disease)   . Headache    MIGRAINE  . Heart murmur    SURGICAL REPAIR  . HOH (hard of hearing)   . Hypercholesterolemia   . Hypertension   . Myocardial infarction ([place])   .  Neuromuscular disorder (HCC)    MUSCLE WEAKNESS  . Osteoporosis   . Peripheral vascular disease (HCC)   . Presence of permanent cardiac pacemaker   . Seizures (HCC)    x1 years ago no longer on anti seizure medicines  . Stroke [facility]) 1996   more weakness left  side CEREBELLAR STROKE SYNDROME  . Tunnel vision   . Vision loss    bil tunnel vision    SOCIAL HX:  Social History   Tobacco Use  . Smoking status: Former Games developer  . Smokeless tobacco: Never Used  Substance Use Topics  . Alcohol use: No    ALLERGIES: No Known Allergies   PERTINENT MEDICATIONS:  Outpatient Encounter Medications as of 73/07/2019  Medication Sig  . acetaminophen (TYLENOL) 325 MG tablet Take 650 mg by mouth every 6 (six) hours as needed for mild pain.  . calcium gluconate 500 MG tablet Take 500 mg by mouth 2 (two) times daily.  Marland Kitchen donepezil (ARICEPT) 10 MG tablet Take 10 mg by mouth daily.   . furosemide (LASIX) 20 MG tablet Take 20 mg by mouth daily.   . hydroxypropyl methylcellulose / hypromellose (ISOPTO TEARS / GONIOVISC) 2.5 % ophthalmic solution Place 2 drops into both eyes daily as needed for dry eyes.  Marland Kitchen loperamide (IMODIUM A-D) 2 MG tablet Take 4 mg by mouth See admin instructions. Give 2 tablets after first loose stool and 1 tablet after each consecutive stool  . memantine (NAMENDA) 10 MG tablet Take 10 mg by mouth 2 (two) times daily.   . metolazone (ZAROXOLYN) 2.5 MG tablet Take 2.5 mg by mouth daily.  . metoprolol tartrate (LOPRESSOR) 25 MG tablet Take 37.5 mg by mouth 2 (two) times daily.   . Multiple Vitamin (MULTIVITAMIN WITH MINERALS) TABS tablet Take 1 tablet by mouth daily.  . ramipril (ALTACE) 5 MG capsule Take 5 mg by mouth daily.   . simvastatin (ZOCOR) 10 MG tablet Take 10 mg by mouth at bedtime.   . tamsulosin (FLOMAX) 0.4 MG CAPS capsule Take 0.4 mg by mouth daily.  . Vitamin D, Ergocalciferol, (DRISDOL) 50000 units CAPS capsule Take 50,000 Units by mouth every 30 (thirty) days. (take on the 7th of the month)   No facility-administered encounter medications on file as of 73/07/2019.    PHYSICAL EXAM / ROS:   Current and past weights: 148.3 but eating well at some meals. This is a 20% weight loss over 3 months. General: NAD, frail appearing,  thin, endorses 8/10 pain. Cardiovascular: no chest pain reported, no edema Pulmonary: no cough, no increased SOB, oxygen 3 L [place], had covid 07/19/19 Abdomen: appetite fair to good at times, mech soft and thin liquids, denies  constipation, incontinent of bowel, albumin 2.7 om 08/25/19. Albumin 3.5- 3.2 g/dl  on 63/14 per hospital record. GU: denies dysuria, incontinent of urine MSK:  no joint deformities, non ambulatory and was walking with walker prior to covid infection.  Skin: sacral stage 4 wound. Worsened in 1 month from stage 3.  Neurological: h/o CVA and L  sided weakness, endorses great pain in sacrum as "awful.  " PAINAD 8/10.  John Lofts, NP [facility]

## 2019-09-20 ENCOUNTER — Other Ambulatory Visit: Payer: Self-pay

## 2019-09-20 ENCOUNTER — Non-Acute Institutional Stay: Payer: Medicare Other | Admitting: Primary Care

## 2019-09-20 DIAGNOSIS — Z515 Encounter for palliative care: Secondary | ICD-10-CM

## 2019-09-20 NOTE — Progress Notes (Signed)
Designer, jewellery Palliative Care Consult Note Telephone: [phone]  Fax: [phone]  TELEHEALTH VISIT STATEMENT Due to the COVID-19 crisis, this visit was done via telemedicine from my office. It was initiated and consented to by this patient and/or family.  PATIENT NAME: Edger Husain [facility] [place] [address] [phone] (home)  DOB: July 16, 1946 MRN: [id number]  PRIMARY CARE PROVIDER:   Juluis Pitch, MD, [address] [place] [address] [phone]  REFERRING PROVIDER:  Juluis Pitch, MD [phone] [address] [place],  [place] [address] [phone]  RESPONSIBLE PARTY:   Extended Emergency Contact Information Primary Emergency Contact: Purl, [place] Mobile Phone: [phone] Relation: Nephew  [email or web address]    ASSESSMENT AND RECOMMENDATIONS:   1. Advance Care Planning/Goals of Care: Goals include to maximize quality of life and symptom management. I had discussed hospice with POA but patient has begun to thrive and will continue with PT and supportive care. MOST on file for DNR, comfort measures, abx use,  IV use, no feeding tube. Email update sent per POA request.   2. Symptom Management:   Nutrition  Has prostat, house shakes tid. Intake is reported as 100%. He states he eats too much but his weight is still low.Albumin on 12/15 was 3,  which is an improvement.  Function: Has been doing PT, states he has more to recover. Using rollator now with stand by assistance.  Function is improving.  Mentation: Forgetful, states he was too fat but is surprised with information that he was 30 lbs underweight. States he does not remember being in hospital.  Oxygen: Check levels if he needs to continue. He was not wearing during interview and did not seem distressed. PT could check it while ambulating him to see if he still meets criteria.  Pain: This had been uncontrolled at our last visit but he now has ATC acetaminophen  and prn tramadol. He states no pain (0/10) today.   3. Family /Caregiver/Community Supports: Nephew is POA, I will email per his request to update, securely. Lives in [place].  4. Cognitive / Functional decline: Forgetful, cognitive impairment at baseline. Is improving in function from not able to do any self care to some adls, ambulation with standby.  5. Follow up Palliative Care Visit: Palliative care will continue to follow for goals of care clarification and symptom management. Return 4 weeks or prn.  I spent 35 minutes providing this consultation,  from 1445 to 1520. More than 50% of the time in this consultation was spent coordinating communication.   HISTORY OF PRESENT ILLNESS:  Lum Stillinger is a 73 y.o. year old male with multiple medical problems including recent covid +,arthritis, CHF, dementia, hyperlipidemia, hypertension, MI, peripheral vascular disease, seizures, recentaltered mental statusand pleural effusion in 11/20.Marland Kitchen Palliative Care was asked to follow this patient by consultation request of Juluis Pitch, MD to help address advance care planning and goals of care. This is a follow up visit.  CODE STATUS: DNR, comfort measures, use of iv fluids and antibiotics, and no feeding tube.   PPS: 30% and improving HOSPICE ELIGIBILITY/DIAGNOSIS: TBD, has recently decided to pursue therapy.  PAST MEDICAL HISTORY:  Past Medical History:  Diagnosis Date  . Arthritis   . CHF (congestive heart failure) ([place])   . Coronary artery disease   . Dementia ([place])    VASCULAR  . Dementia ([place])   . Diarrhea    SEVERAL DAYS/ GIVEN MED TO STOP AT HOME HE LIVES IN  . Dysrhythmia  AFIB  . GERD (gastroesophageal reflux disease)   . Headache    MIGRAINE  . Heart murmur    SURGICAL REPAIR  . HOH (hard of hearing)   . Hypercholesterolemia   . Hypertension   . Myocardial infarction (HCC)   . Neuromuscular disorder (HCC)    MUSCLE WEAKNESS  . Osteoporosis   . Peripheral vascular  disease (HCC)   . Presence of permanent cardiac pacemaker   . Seizures (HCC)    x1 years ago no longer on anti seizure medicines  . 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 Tunnel vision   . Vision loss    bil tunnel vision    SOCIAL HX:  Social History   Tobacco Use  . Smoking status: Former Games developer  . Smokeless tobacco: Never Used  Substance Use Topics  . Alcohol use: No    ALLERGIES: No Known Allergies   PERTINENT MEDICATIONS:  Outpatient Encounter Medications as of 09/20/2019  Medication Sig  . acetaminophen (TYLENOL) 325 MG tablet Take 650 mg by mouth every 6 (six) hours as needed for mild pain.  . calcium gluconate 500 MG tablet Take 500 mg by mouth 2 (two) times daily.  Marland Kitchen donepezil (ARICEPT) 10 MG tablet Take 10 mg by mouth daily.   . furosemide (LASIX) 20 MG tablet Take 20 mg by mouth daily.   . hydroxypropyl methylcellulose / hypromellose (ISOPTO TEARS / GONIOVISC) 2.5 % ophthalmic solution Place 2 drops into both eyes daily as needed for dry eyes.  Marland Kitchen loperamide (IMODIUM A-D) 2 MG tablet Take 4 mg by mouth See admin instructions. Give 2 tablets after first loose stool and 1 tablet after each consecutive stool  . memantine (NAMENDA) 10 MG tablet Take 10 mg by mouth 2 (two) times daily.   . metolazone (ZAROXOLYN) 2.5 MG tablet Take 2.5 mg by mouth daily.  . metoprolol tartrate (LOPRESSOR) 25 MG tablet Take 37.5 mg by mouth 2 (two) times daily.   . Multiple Vitamin (MULTIVITAMIN WITH MINERALS) TABS tablet Take 1 tablet by mouth daily.  . ramipril (ALTACE) 5 MG capsule Take 5 mg by mouth daily.   . simvastatin (ZOCOR) 10 MG tablet Take 10 mg by mouth at bedtime.   . tamsulosin (FLOMAX) 0.4 MG CAPS capsule Take 0.4 mg by mouth daily.  . Vitamin D, Ergocalciferol, (DRISDOL) 50000 units CAPS capsule Take 50,000 Units by mouth every 30 (thirty) days. (take on the 7th of the month)   No facility-administered encounter medications on file as of  09/20/2019.    PHYSICAL EXAM / ROS:   Current and past weights: 163-175 lb he reports as his norm Currently is 147.2 lb and weighing daily. Last wt I recorded 2 weeks ago was 148. Lbs.  General: NAD, frail appearing, thin Cardiovascular: no chest pain reported, no edema  Pulmonary: no cough, no increased SOB, uses oxygen still, but is off during interview without symptoms. Abdomen: appetite has been good, denies  constipation, continent of bowel GU: denies dysuria, continent of urine MSK:  no joint deformities, ambulatory with rollator and standby. Skin: Subjectively improving. Changed 3 x/ week Neurological: Weakness, forgetful, baseline dementia, Sleeping well at night .   Eliezer Lofts, NP   [facility]

## 2019-09-30 ENCOUNTER — Other Ambulatory Visit
Admission: RE | Admit: 2019-09-30 | Discharge: 2019-09-30 | Disposition: A | Discharge status: Home / Self Care | Payer: Medicare Other | Source: Ambulatory Visit | Attending: Family Medicine | Admitting: Family Medicine

## 2019-09-30 DIAGNOSIS — R6889 Other general symptoms and signs: Secondary | ICD-10-CM | POA: Diagnosis present

## 2019-09-30 LAB — COMPREHENSIVE METABOLIC PANEL
ALT: 8 U/L (ref 0–44)
AST: 12 U/L — ABNORMAL LOW (ref 15–41)
Albumin: 2.4 g/dL — ABNORMAL LOW (ref 3.5–5.0)
Alkaline Phosphatase: 78 U/L (ref 38–126)
Anion gap: 13 (ref 5–15)
BUN: 20 mg/dL (ref 8–23)
CO2: 26 mmol/L (ref 22–32)
Calcium: 8.1 mg/dL — ABNORMAL LOW (ref 8.9–10.3)
Chloride: 92 mmol/L — ABNORMAL LOW (ref 98–111)
Creatinine, Ser: 0.75 mg/dL (ref 0.61–1.24)
GFR calc Af Amer: >60 mL/min (ref >60)
GFR calc non Af Amer: >60 mL/min (ref >60)
Glucose, Bld: 113 mg/dL — ABNORMAL HIGH (ref 70–99)
Potassium: 3.7 mmol/L (ref 3.5–5.1)
Sodium: 131 mmol/L — ABNORMAL LOW (ref 135–145)
Total Bilirubin: 0.6 mg/dL (ref 0.3–1.2)
Total Protein: 5.4 g/dL — ABNORMAL LOW (ref 6.5–8.1)

## 2019-09-30 LAB — CBC
HCT: 27.2 % — ABNORMAL LOW (ref 39.0–52.0)
Hemoglobin: 9.2 g/dL — ABNORMAL LOW (ref 13.0–17.0)
MCH: 27.6 pg (ref 26.0–34.0)
MCHC: 33.8 g/dL (ref 30.0–36.0)
MCV: 81.7 fL (ref 80.0–100.0)
Platelets: 152 10*3/uL (ref 150–400)
RBC: 3.33 MIL/uL — ABNORMAL LOW (ref 4.22–5.81)
RDW: 18.4 % — ABNORMAL HIGH (ref 11.5–15.5)
WBC: 7.8 10*3/uL (ref 4.0–10.5)
nRBC: 0 % (ref 0.0–0.2)

## 2019-10-01 ENCOUNTER — Other Ambulatory Visit
Admission: RE | Admit: 2019-10-01 | Discharge: 2019-10-01 | Disposition: A | Discharge status: Home / Self Care | Payer: Medicare Other | Source: Ambulatory Visit | Attending: Family Medicine | Admitting: Family Medicine

## 2019-10-01 DIAGNOSIS — M869 Osteomyelitis, unspecified: Secondary | ICD-10-CM | POA: Diagnosis present

## 2019-10-01 LAB — CREATININE, SERUM
Creatinine, Ser: 0.72 mg/dL (ref 0.61–1.24)
GFR calc Af Amer: >60 mL/min (ref >60)
GFR calc non Af Amer: >60 mL/min (ref >60)

## 2019-10-01 LAB — VANCOMYCIN, TROUGH: Vancomycin Tr: 16 ug/mL (ref 15–20)

## 2019-10-06 ENCOUNTER — Other Ambulatory Visit
Admission: RE | Admit: 2019-10-06 | Discharge: 2019-10-06 | Disposition: A | Discharge status: Home / Self Care | Payer: Medicare Other | Source: Ambulatory Visit | Attending: Family Medicine | Admitting: Family Medicine

## 2019-10-06 DIAGNOSIS — M4627 Osteomyelitis of vertebra, lumbosacral region: Secondary | ICD-10-CM | POA: Diagnosis present

## 2019-10-06 LAB — VANCOMYCIN, TROUGH: Vancomycin Tr: 47 ug/mL (ref 15–20)

## 2019-10-08 ENCOUNTER — Other Ambulatory Visit
Admission: RE | Admit: 2019-10-08 | Discharge: 2019-10-08 | Disposition: A | Discharge status: Home / Self Care | Payer: Medicare Other | Attending: Family Medicine | Admitting: Family Medicine

## 2019-10-08 DIAGNOSIS — M869 Osteomyelitis, unspecified: Secondary | ICD-10-CM | POA: Insufficient documentation

## 2019-10-08 LAB — CREATININE, SERUM
Creatinine, Ser: 1.3 mg/dL — ABNORMAL HIGH (ref 0.61–1.24)
GFR calc Af Amer: >60 mL/min (ref >60)
GFR calc non Af Amer: 54 mL/min — ABNORMAL LOW (ref >60)

## 2019-10-08 LAB — VANCOMYCIN, TROUGH: Vancomycin Tr: 33 ug/mL (ref 15–20)

## 2019-10-18 ENCOUNTER — Non-Acute Institutional Stay: Payer: Medicare Other | Admitting: Primary Care

## 2019-10-18 ENCOUNTER — Other Ambulatory Visit: Payer: Self-pay

## 2019-10-18 DIAGNOSIS — Z515 Encounter for palliative care: Secondary | ICD-10-CM

## 2019-10-18 NOTE — Progress Notes (Signed)
Designer, jewellery Palliative Care Consult Note Telephone: [phone]  Fax: [phone]  TELEHEALTH VISIT STATEMENT Due to the COVID-19 crisis, this visit was done via telemedicine from my office. It was initiated and consented to by this patient and/or family.  PATIENT NAME: John Rodgers [facility] [place] [address] [phone] (home)  DOB: 01-08-1946 MRN: [id number]  PRIMARY CARE PROVIDER:   Juluis Pitch, MD, [address] [place] [address] [phone]  REFERRING PROVIDER:  Juluis Pitch, MD [phone] [place],  [place] [address] [phone]  RESPONSIBLE PARTY:   Extended Emergency Contact Information Primary Emergency Contact: Emersen, Carroll Mobile Phone: [phone] Relation: Nephew   ASSESSMENT AND RECOMMENDATIONS:   1. Advance Care Planning/Goals of Care: Goals include to maximize quality of life and symptom management.DNR, comfort measures, use of iv fluids and antibiotics, and no feeding tube.   2. Symptom Management:   Pain: Recommend ATC narcotics for baseline pain in wound in addition to prn pain management, e.g. tramadol 50 mg q 6 hours or similar. Endorses ongoing wound pain and has some combativeness with personal care. Cannot rate. Treating pain ATC may address pain as well as this new restlessness.  Constipation: Does not complain of constipation.Staff denies constipation.   Wound: Cephtin, Flagyl and vancomycin for wound infection.Continue to monitor although wound healing will be difficult with debility.  Agitation: Staff states he is resistant to care and needs agitation addressed. Recommend addressing pain ATC and lorazepam Prn.  3. Family /Caregiver/Community Supports: Has nephew who is Media planner.   4. Cognitive / Functional decline:  Forgetful, cognitive impairment worsening. Losing function and ability for adls.  5. Follow up Palliative Care Visit: Palliative care will continue to follow  for goals of care clarification and symptom management. Return 3 weeks or prn.  I spent 25 minutes providing this consultation,  from 1430 to 1455. More than 50% of the time in this consultation was spent coordinating communication.   HISTORY OF PRESENT ILLNESS:  Markus Casten is a 74 y.o. year old male with multiple medical problems including recent abnormal weight loss, skin breakdown, covid +,arthritis, CHF, dementia, hyperlipidemia, hypertension, MI, peripheral vascular disease, seizures, recentaltered mental statusand pleural effusion in 11/20. Palliative Care was asked to follow this patient by consultation request of Juluis Pitch, MD to help address advance care planning and goals of care. This is a follow up visit.  CODE STATUS:  DNR, comfort measures, use of iv fluids and antibiotics, and no feeding tube.  PPS: 30% HOSPICE ELIGIBILITY/DIAGNOSIS: TBD  PAST MEDICAL HISTORY:  Past Medical History:  Diagnosis Date  . Arthritis   . CHF (congestive heart failure) ([place])   . Coronary artery disease   . Dementia ([place])    VASCULAR  . Dementia ([place])   . Diarrhea    SEVERAL DAYS/ GIVEN MED TO STOP AT HOME HE LIVES IN  . Dysrhythmia    AFIB  . GERD (gastroesophageal reflux disease)   . Headache    MIGRAINE  . Heart murmur    SURGICAL REPAIR  . HOH (hard of hearing)   . Hypercholesterolemia   . Hypertension   . Myocardial infarction ([place])   . Neuromuscular disorder ([place])    MUSCLE WEAKNESS  . Osteoporosis   . Peripheral vascular disease ([place])   . Presence of permanent cardiac pacemaker   . Seizures ([place])    x1 years ago no longer on anti seizure medicines  . Stroke [facility]) 1996  more weakness left side CEREBELLAR STROKE SYNDROME  . Tunnel vision   . Vision loss    bil tunnel vision    SOCIAL HX:  Social History   Tobacco Use  . Smoking status: Former Games developer  . Smokeless tobacco: Never Used  Substance Use Topics  . Alcohol use: No    ALLERGIES: No Known Allergies    PERTINENT MEDICATIONS:  Outpatient Encounter Medications as of 10/18/2019  Medication Sig  . acetaminophen (TYLENOL) 325 MG tablet Take 650 mg by mouth every 6 (six) hours as needed for mild pain.  . calcium gluconate 500 MG tablet Take 500 mg by mouth 2 (two) times daily.  Marland Kitchen donepezil (ARICEPT) 10 MG tablet Take 10 mg by mouth daily.   . furosemide (LASIX) 20 MG tablet Take 20 mg by mouth daily.   . hydroxypropyl methylcellulose / hypromellose (ISOPTO TEARS / GONIOVISC) 2.5 % ophthalmic solution Place 2 drops into both eyes daily as needed for dry eyes.  Marland Kitchen loperamide (IMODIUM A-D) 2 MG tablet Take 4 mg by mouth See admin instructions. Give 2 tablets after first loose stool and 1 tablet after each consecutive stool  . memantine (NAMENDA) 10 MG tablet Take 10 mg by mouth 2 (two) times daily.   . metolazone (ZAROXOLYN) 2.5 MG tablet Take 2.5 mg by mouth daily.  . metoprolol tartrate (LOPRESSOR) 25 MG tablet Take 37.5 mg by mouth 2 (two) times daily.   . Multiple Vitamin (MULTIVITAMIN WITH MINERALS) TABS tablet Take 1 tablet by mouth daily.  . ramipril (ALTACE) 5 MG capsule Take 5 mg by mouth daily.   . simvastatin (ZOCOR) 10 MG tablet Take 10 mg by mouth at bedtime.   . tamsulosin (FLOMAX) 0.4 MG CAPS capsule Take 0.4 mg by mouth daily.  . Vitamin D, Ergocalciferol, (DRISDOL) 50000 units CAPS capsule Take 50,000 Units by mouth every 30 (thirty) days. (take on the 7th of the month)   No facility-administered encounter medications on file as of 10/18/2019.    PHYSICAL EXAM / ROS:   Current and past weights: 142 lbs. 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  Eliezer Lofts, NP [facility]

## 2019-10-20 ENCOUNTER — Other Ambulatory Visit: Payer: Self-pay

## 2019-10-20 ENCOUNTER — Non-Acute Institutional Stay: Payer: Medicare Other | Admitting: Primary Care

## 2019-10-20 DIAGNOSIS — Z515 Encounter for palliative care: Secondary | ICD-10-CM

## 2019-10-20 NOTE — Progress Notes (Signed)
Therapist, nutritional Palliative Care Consult Note Telephone: [phone]  Fax: [phone]  TELEHEALTH VISIT STATEMENT Due to the COVID-19 crisis, this visit was done via telemedicine from my office. It was initiated and consented to by this patient and/or family.  PATIENT NAME: John Rodgers [facility] [address] [place] [address] [phone] (home)  DOB: [birth year] MRN: [id number]  PRIMARY CARE PROVIDER:   Dorothey Baseman, MD, [address] [place] [address] [phone]  REFERRING PROVIDER:  Dorothey Baseman, MD [phone] [address] [place],  [place] [address] [phone]  RESPONSIBLE PARTY:   Extended Emergency Contact Information Primary Emergency Contact: Breckon, Reeves Mobile Phone: [phone] Relation: Nephew   ASSESSMENT AND RECOMMENDATIONS:   1. Advance Care Planning/Goals of Care: Goals include to maximize quality of life and symptom management. Call made to [facility], to share concerns that patient is rapidly declining and would benefit from hospice services. Call made to [facility] to let them know I left voice mail and asked family to call [facility] with follow up.  2. Symptom Management:    Secretions; Appears to have clear pharyangeal secretions per staff. SNF MD has ordered glycopyrrolate. Education given that this will not dry up what is already formed, but will prevent further formation. SNF staff is suctioning and administering medications.  Pain: Continues to be poorly controlled from sacral wound. Recommend changing to q 4 hr morphine or similar and assessing need to increase.   Deprescribing: If still given, recommend to  stop non essential medications.  3. Family /Caregiver/Community Supports: POA is nephew. Call made to discuss, message left.   4. Cognitive / Functional decline: Less alert than visit 48 hours ago. States pain. Unable to perform adls  5. Follow up Palliative Care Visit: Hospice admission recommended  I spent 25  minutes providing this consultation,  from 1530  to 1555. More than 50% of the time in this consultation was spent coordinating communication.   HISTORY OF PRESENT ILLNESS:  John Rodgers is a 74 y.o. year old male with multiple medical problems including abnormal weight loss, skin breakdown, covid +,arthritis, CHF, dementia, hyperlipidemia, hypertension, MI, peripheral vascular disease, seizures, recentaltered mental statusand pleural effusion in 11/20. Palliative Care was asked to follow this patient by consultation request of Dorothey Baseman, MD to help address advance care planning and goals of care. This is a follow up visit.  CODE STATUS: DNR, comfort measures, use of iv fluids and antibiotics, and no feeding tube.  PPS: 20% HOSPICE ELIGIBILITY/DIAGNOSIS: yes/abnormal weight loss  PAST MEDICAL HISTORY:  Past Medical History:  Diagnosis Date   Arthritis    CHF (congestive heart failure) ([facility])    Coronary artery disease    Dementia ([facility])    VASCULAR   Dementia ([facility])    Diarrhea    SEVERAL DAYS/ GIVEN MED TO STOP AT HOME HE LIVES IN   Dysrhythmia    AFIB   GERD (gastroesophageal reflux disease)    Headache    MIGRAINE   Heart murmur    SURGICAL REPAIR   HOH (hard of hearing)    Hypercholesterolemia    Hypertension    Myocardial infarction ([facility])    Neuromuscular disorder (H[facility])    MUSCLE WEAKNESS   Osteoporosis    Peripheral vascular disease ([facility])    Presence of permanent cardiac pacemaker    Seizures ([facility])    x1 years ago no longer on anti seizure medicines   Stroke ([facility]) 1996   more weakness left side CEREBELLAR  STROKE SYNDROME   Tunnel vision    Vision loss    bil tunnel vision    SOCIAL HX:  Social History   Tobacco Use   Smoking status: Former Smoker   Smokeless tobacco: Never Used  Substance Use Topics   Alcohol use: No    ALLERGIES: No Known Allergies   PERTINENT MEDICATIONS:  Outpatient Encounter Medications as of 10/20/2019    Medication Sig   acetaminophen (TYLENOL) 325 MG tablet Take 650 mg by mouth every 6 (six) hours as needed for mild pain.   calcium gluconate 500 MG tablet Take 500 mg by mouth 2 (two) times daily.   donepezil (ARICEPT) 10 MG tablet Take 10 mg by mouth daily.    furosemide (LASIX) 20 MG tablet Take 20 mg by mouth daily.    hydroxypropyl methylcellulose / hypromellose (ISOPTO TEARS / GONIOVISC) 2.5 % ophthalmic solution Place 2 drops into both eyes daily as needed for dry eyes.   loperamide (IMODIUM A-D) 2 MG tablet Take 4 mg by mouth See admin instructions. Give 2 tablets after first loose stool and 1 tablet after each consecutive stool   memantine (NAMENDA) 10 MG tablet Take 10 mg by mouth 2 (two) times daily.    metolazone (ZAROXOLYN) 2.5 MG tablet Take 2.5 mg by mouth daily.   metoprolol tartrate (LOPRESSOR) 25 MG tablet Take 37.5 mg by mouth 2 (two) times daily.    Multiple Vitamin (MULTIVITAMIN WITH MINERALS) TABS tablet Take 1 tablet by mouth daily.   ramipril (ALTACE) 5 MG capsule Take 5 mg by mouth daily.    simvastatin (ZOCOR) 10 MG tablet Take 10 mg by mouth at bedtime.    tamsulosin (FLOMAX) 0.4 MG CAPS capsule Take 0.4 mg by mouth daily.   Vitamin D, Ergocalciferol, (DRISDOL) 50000 units CAPS capsule Take 50,000 Units by mouth every 30 (thirty) days. (take on the 7th of the month)   No facility-administered encounter medications on file as of 10/20/2019.    PHYSICAL EXAM / ROS:   Current and past weights: declining abruptly General: NAD, frail appearing, thin, face drawn Cardiovascular: no chest pain reported, no edema reported Pulmonary: no cough, no increased SOB, oxygen 3 L. Terminal secretions present Abdomen: appetite poor, incontinent of bowel GU: denies dysuria, incontinent of urine MSK:  no joint deformities, bed bound Skin: stage 4 ulcer Neurological: Weakness, pain 7-8/10 on PAINAD , UE tremors.  Jason Coop, NP

## 2019-11-01 ENCOUNTER — Other Ambulatory Visit: Payer: Self-pay

## 2019-11-01 ENCOUNTER — Non-Acute Institutional Stay: Payer: Medicare Other | Admitting: Primary Care

## 2019-11-01 DIAGNOSIS — Z515 Encounter for palliative care: Secondary | ICD-10-CM

## 2019-11-01 NOTE — Progress Notes (Signed)
Therapist, nutritional Palliative Care Consult Note Telephone: [phone]  Fax: [phone]  TELEHEALTH VISIT STATEMENT Due to the COVID-19 crisis, this visit was done via telemedicine from my office. It was initiated and consented to by this patient and/or family.  PATIENT NAME: John Rodgers [facility] [address] [place] [address] [phone] (home)  DOB: [birth year] MRN: [id number]  PRIMARY CARE PROVIDER:   Dorothey Baseman, MD, [address] [place] [address] [phone]  REFERRING PROVIDER:  Dorothey Baseman, MD [phone] [address] [place],  [place] [address] [phone]  RESPONSIBLE PARTY:   Extended Emergency Contact Information Primary Emergency Contact: Jayvien, Rowlette Mobile Phone: [phone] Relation: Nephew   ASSESSMENT AND RECOMMENDATIONS:   1. Advance Care Planning/Goals of Care: Goals include to maximize quality of life and symptom management. DNR, comfort measures, use of iv fluids and antibiotics, and no feeding tube.  2. Symptom Management:   Nutrition: 145 lbs now. Not losing more weight at this time although has lost 30 lbs from baseline. States poor appetite. Recommend mirtazipine or megace.  Pain: May benefit from a fentanyl patch for better consistent pain control.Currenlty hasTramadol scheduled and Tylenol scheduled. Oxycodone prn. May benefit from a fentanyl patch for better pain control. 25 mcg would equate 200 mg tramadol daily, and could be adjusted up depending on prn use. Patient states he sometimes cannot remember to ask for prn pain meds, but does have significant pain in his buttock from wound.  3. Family /Caregiver/Community Supports: Nephew is POA. Mr. Luhn is making some of his own decisions at this time. Lives in LTC.  4. Cognitive / Functional decline:  Alert x 1-2 ,confused at times. Needs assistance with most adls, all iadls.   5. Follow up Palliative Care Visit: Palliative care will continue to follow for  goals of care clarification and symptom management. Return 4-6 weeks or prn.  I spent 25 minutes providing this consultation,  from 1400 to 1425. More than 50% of the time in this consultation was spent coordinating communication.   HISTORY OF PRESENT ILLNESS:  John Rodgers is a 74 y.o. year old male with multiple medical problems including abnormal weight loss, skin breakdown,covid +,arthritis, CHF, dementia, hyperlipidemia, hypertension, MI, peripheral vascular disease, seizures, recentaltered mental statusand pleural effusion in 11/20.Marland Kitchen Palliative Care was asked to follow this patient by consultation request of Dorothey Baseman, MD to help address advance care planning and goals of care. This is a follow up visit.  CODE STATUS: DNR, comfort measures, use of iv fluids and antibiotics, and no feeding tube.  PPS: 30% HOSPICE ELIGIBILITY/DIAGNOSIS: TBD  PAST MEDICAL HISTORY:  Past Medical History:  Diagnosis Date  . Arthritis   . CHF (congestive heart failure) ([facility])   . Coronary artery disease   . Dementia ([facility])    VASCULAR  . Dementia ([facility])   . Diarrhea    SEVERAL DAYS/ GIVEN MED TO STOP AT HOME HE LIVES IN  . Dysrhythmia    AFIB  . GERD (gastroesophageal reflux disease)   . Headache    MIGRAINE  . Heart murmur    SURGICAL REPAIR  . HOH (hard of hearing)   . Hypercholesterolemia   . Hypertension   . Myocardial infarction ([facility])   . Neuromuscular disorder ([facility])    MUSCLE WEAKNESS  . Osteoporosis   . Peripheral vascular disease ([facility])   . Presence of permanent cardiac pacemaker   . Seizures ([facility])    x1 years ago no longer on anti seizure  medicines  . Stroke [facility]) 1996   more weakness left side CEREBELLAR STROKE SYNDROME  . Tunnel vision   . Vision loss    bil tunnel vision    SOCIAL HX:  Social History   Tobacco Use  . Smoking status: Former Research scientist (life sciences)  . Smokeless tobacco: Never Used  Substance Use Topics  . Alcohol use: No    ALLERGIES: No Known Allergies     PERTINENT MEDICATIONS:  Outpatient Encounter Medications as of 11/01/2019  Medication Sig  . acetaminophen (TYLENOL) 325 MG tablet Take 650 mg by mouth every 6 (six) hours as needed for mild pain.  . calcium gluconate 500 MG tablet Take 500 mg by mouth 2 (two) times daily.  Marland Kitchen donepezil (ARICEPT) 10 MG tablet Take 10 mg by mouth daily.   . furosemide (LASIX) 20 MG tablet Take 20 mg by mouth daily.   . hydroxypropyl methylcellulose / hypromellose (ISOPTO TEARS / GONIOVISC) 2.5 % ophthalmic solution Place 2 drops into both eyes daily as needed for dry eyes.  Marland Kitchen loperamide (IMODIUM A-D) 2 MG tablet Take 4 mg by mouth See admin instructions. Give 2 tablets after first loose stool and 1 tablet after each consecutive stool  . memantine (NAMENDA) 10 MG tablet Take 10 mg by mouth 2 (two) times daily.   . metolazone (ZAROXOLYN) 2.5 MG tablet Take 2.5 mg by mouth daily.  . metoprolol tartrate (LOPRESSOR) 25 MG tablet Take 37.5 mg by mouth 2 (two) times daily.   . Multiple Vitamin (MULTIVITAMIN WITH MINERALS) TABS tablet Take 1 tablet by mouth daily.  . ramipril (ALTACE) 5 MG capsule Take 5 mg by mouth daily.   . simvastatin (ZOCOR) 10 MG tablet Take 10 mg by mouth at bedtime.   . tamsulosin (FLOMAX) 0.4 MG CAPS capsule Take 0.4 mg by mouth daily.  . Vitamin D, Ergocalciferol, (DRISDOL) 50000 units CAPS capsule Take 50,000 Units by mouth every 30 (thirty) days. (take on the 7th of the month)   No facility-administered encounter medications on file as of 11/01/2019.    PHYSICAL EXAM / ROS:   Current and past weights: 146 lbs, weight has stablized General: NAD, frail appearing, thin Cardiovascular: no chest pain reported, no edema reported Pulmonary: no cough, no increased SOB, oxygen at 3 L Abdomen: appetite fair, denies constipation, incontinent of bowel GU: denies dysuria, incontinent of urine MSK:  no joint deformities, non ambulatory Skin: pain in wound, stage 4 sacral ulcer Neurological:  Weakness, sleeps well if no pain, has had a lot of pain, dementia moderate stages.  Jason Coop, NP, [facility]

## 2019-11-03 ENCOUNTER — Other Ambulatory Visit: Payer: Self-pay

## 2019-11-03 ENCOUNTER — Non-Acute Institutional Stay: Payer: Medicare Other | Admitting: Primary Care

## 2019-11-03 DIAGNOSIS — Z515 Encounter for palliative care: Secondary | ICD-10-CM

## 2019-11-03 NOTE — Progress Notes (Signed)
[facility] Pall[facility] Consult Note Telephone: [phone]  Fax: [phone]  TELEHEALTH VISIT STATEMENT Due to the COVID-19 crisis, this visit was done via telemedicine from my office. It was initiated and consented to by this patient and/or family.  PATIENT NAME: John Rodgers [facility] [place] [address] [phone] (home)  DOB: 1946-07-10 MRN: [id number]  PRIMARY CARE PROVIDER:   Juluis Pitch, MD, [address] [place] [address] [phone]  REFERRING PROVIDER:  Juluis Pitch, MD [phone] [address] [place],  [place] [address] [phone]  RESPONSIBLE PARTY:   Extended Emergency Contact Information Primary Emergency Contact: John Rodgers Mobile Phone: [phone] Relation: Nephew    ASSESSMENT AND RECOMMENDATIONS:   1. Advance Care Planning/Goals of Care: Goals include to maximize quality of life and symptom management. I met with POA John Rodgers today by telephonic method for goals of care. We discussed continued decline of John Rodgers, his wound and poor intake. He also has multiple infections that may not be able to be cleared. John Rodgers states his goal is for his uncle to be comfortable and out of pain. We discussed perhaps his uncle wanted to have him and his father visit. John Rodgers stated his father, John Rodgers is patient's younger brother but will not be able to visit or make a phone call to say goodbye. WE discussed benefit of chaplain services in hospice to patient as John Rodgers also mentioned his uncle is very fearful of dying. John Rodgers requests a Family conference  [email or web address] to discuss his status. I feel his admission to hospice is urgent and would recommend revisiting this admission asap.   2. Symptom Management:   Family dynamic/ psychosocial: John Rodgers is brother and they call patient "John Rodgers". Nephew states his father will not be able to visit or talk on the phone. They are not estranged but they are not close. He does not  feel emotionally he can contact patient. Hospice is still appropriate and family wants him out of pain. They would like SNF representative to call to discuss his current situation.  Pain:   Nephew Wants him to be out of pain and comfortable.Discussed pain control with floor nurses. He is remaining agitated after pain medication administration. This agitation should be treated as pain first. I  Recommend d/c tramadol and begin Roxanol 5 mg q 4 hours around the clock with plan to increase dosage or frequency to maximize pain management. Narcotic by oral method peaks effect at 1 hour and half-life, point needing additional dosing, is 4 hours.   3. Family /Caregiver/Community Supports: POA is nephew John Rodgers. Patient's brother is John Rodgers but will not be able to communicate with the patient. John Rodgers would like a family meeting next week although patient's time may be short and he would benefit from hospice admission. T/c to SNF SW John Rodgers who will f/u with family request.  4. Cognitive / Functional decline: Alert, O x 1. Unable to participate in adls.  5. Follow up Palliative Care Visit: Recommend hospice admission. Return 1 weeks or prn if no hospice admission.  I spent 30 minutes providing this consultation,  from 1130 to 1200. More than 50% of the time in this consultation was spent coordinating communication.   HISTORY OF PRESENT ILLNESS:  John Rodgers is a 74 y.o. year old male with multiple medical problems including abnormal weight loss, skin breakdown,covid +,arthritis, CHF, dementia, hyperlipidemia, hypertension, MI, peripheral vascular disease, seizures, recentaltered mental statusand pleural effusion in 11/20. Palliative Care was asked to follow this  patient by consultation request of Juluis Pitch, MD to help address advance care planning and goals of care. This is a follow up visit.  CODE STATUS: DNR, comfort measures  PPS: 30% HOSPICE ELIGIBILITY/DIAGNOSIS: yes/CHF, CAD, stage 4 decubitus,  abnormal weight loss.  PAST MEDICAL HISTORY:  Past Medical History:  Diagnosis Date  . Arthritis   . CHF (congestive heart failure) ([place])   . Coronary artery disease   . Dementia ([place])    VASCULAR  . Dementia ([place])   . Diarrhea    SEVERAL DAYS/ GIVEN MED TO STOP AT HOME HE LIVES IN  . Dysrhythmia    AFIB  . GERD (gastroesophageal reflux disease)   . Headache    MIGRAINE  . Heart murmur    SURGICAL REPAIR  . HOH (hard of hearing)   . Hypercholesterolemia   . Hypertension   . Myocardial infarction ([place])   . Neuromuscular disorder ([place])    MUSCLE WEAKNESS  . Osteoporosis   . Peripheral vascular disease ([place])   . Presence of permanent cardiac pacemaker   . Seizures ([place])    x1 years ago no longer on anti seizure medicines  . Stroke [facility]) 1996   more weakness left side CEREBELLAR STROKE SYNDROME  . Tunnel vision   . Vision loss    bil tunnel vision    SOCIAL HX:  Social History   Tobacco Use  . Smoking status: Former Research scientist (life sciences)  . Smokeless tobacco: Never Used  Substance Use Topics  . Alcohol use: No    ALLERGIES: No Known Allergies   PERTINENT MEDICATIONS:  Outpatient Encounter Medications as of 11/03/2019  Medication Sig  . acetaminophen (TYLENOL) 325 MG tablet Take 650 mg by mouth every 6 (six) hours as needed for mild pain.  . calcium gluconate 500 MG tablet Take 500 mg by mouth 2 (two) times daily.  Marland Kitchen donepezil (ARICEPT) 10 MG tablet Take 10 mg by mouth daily.   . furosemide (LASIX) 20 MG tablet Take 20 mg by mouth daily.   . hydroxypropyl methylcellulose / hypromellose (ISOPTO TEARS / GONIOVISC) 2.5 % ophthalmic solution Place 2 drops into both eyes daily as needed for dry eyes.  Marland Kitchen loperamide (IMODIUM A-D) 2 MG tablet Take 4 mg by mouth See admin instructions. Give 2 tablets after first loose stool and 1 tablet after each consecutive stool  . memantine (NAMENDA) 10 MG tablet Take 10 mg by mouth 2 (two) times daily.   . metolazone (ZAROXOLYN) 2.5 MG tablet Take 2.5  mg by mouth daily.  . metoprolol tartrate (LOPRESSOR) 25 MG tablet Take 37.5 mg by mouth 2 (two) times daily.   . Multiple Vitamin (MULTIVITAMIN WITH MINERALS) TABS tablet Take 1 tablet by mouth daily.  . ramipril (ALTACE) 5 MG capsule Take 5 mg by mouth daily.   . simvastatin (ZOCOR) 10 MG tablet Take 10 mg by mouth at bedtime.   . tamsulosin (FLOMAX) 0.4 MG CAPS capsule Take 0.4 mg by mouth daily.  . Vitamin D, Ergocalciferol, (DRISDOL) 50000 units CAPS capsule Take 50,000 Units by mouth every 30 (thirty) days. (take on the 7th of the month)   No facility-administered encounter medications on file as of 11/03/2019.    PHYSICAL EXAM / ROS:   deferred  Jason Coop, NP  [facility]

## 2019-11-15 ENCOUNTER — Other Ambulatory Visit: Payer: Self-pay

## 2019-11-15 ENCOUNTER — Non-Acute Institutional Stay: Payer: Medicare Other | Admitting: Primary Care

## 2019-12-13 ENCOUNTER — Non-Acute Institutional Stay: Payer: Medicare Other | Admitting: Primary Care

## 2019-12-13 ENCOUNTER — Other Ambulatory Visit: Payer: Self-pay

## 2019-12-13 DIAGNOSIS — Z515 Encounter for palliative care: Secondary | ICD-10-CM

## 2019-12-13 NOTE — Progress Notes (Signed)
[place] Consult Note Telephone: [phone]  Fax: [phone]    PATIENT NAME: John Rodgers [facility] [place] [address] [phone] (home)  DOB: [birth year] MRN: [id number]  PRIMARY CARE PROVIDER:   Juluis Pitch, MD, [address] [place] [address] [phone]  REFERRING PROVIDER:  Juluis Pitch, MD [phone] [place],   [address] [phone]  RESPONSIBLE PARTY:   Extended Emergency Contact Information Primary Emergency Contact: Ladarian, Bonczek Mobile Phone: [phone] Relation: Nephew  ASSESSMENT AND RECOMMENDATIONS:   1. Advance Care Planning/Goals of Care: Goals include to maximize quality of life and symptom management. His POA is nephew Mitzi Hansen. Since I saw him previously they had decided to hold off on hospice referral.  2. Symptom Management:    I met with John Rodgers in his room. He is seemingly  hallucinating, looking up into the corners of the room . He went out today to x-ray to radiology and I will follow up on his imaging. He had no complaints of pain although he still has a significant sacral ulcer  treated by the wound nurse. Appetite is fair and he can he needs help with assistance with feeding with set up. I helped him with his milkshake which he said he would drink.   Constipation: Appears to be having bm constipation. Needs increase of laxative eg miralax, senna-s increase.    3. Family /Caregiver/Community Supports: POA is nephew, has a brother as well. Lives in [place].  4. Cognitive / Functional decline: A and O x 1, hallucinations, staring to the upper ceiling and states "i'm trying to hear her". Needs help with all adls.  5. Follow up Palliative Care Visit: Palliative care will continue to follow for goals of care clarification and symptom management. Return 4-6 weeks or prn.  I spent 25 minutes providing this consultation,  from 1130 to 1155. More than 50% of the time  in this consultation was spent coordinating communication.   HISTORY OF PRESENT ILLNESS:  John Rodgers is a 74 y.o. year old male with multiple medical problems including abnormal weight loss, skin breakdown,covid +,arthritis, CHF, dementia, hyperlipidemia, hypertension, MI, peripheral vascular disease, seizures, recentaltered mental statusand pleural effusion in 11/20.[facility] Palliative Care was asked to follow this patient by consultation request of Juluis Pitch, MD to help address advance care planning and goals of care. This is a follow up visit.  CODE STATUS: DNR, comfort measures  PPS: 30% HOSPICE ELIGIBILITY/DIAGNOSIS: yes/CHF, CAD, stage 4 decubitus, abnormal weight loss.  PAST MEDICAL HISTORY:  Past Medical History:  Diagnosis Date  . Arthritis   . CHF (congestive heart failure) ([place])   . Coronary artery disease   . Dementia ([place])    VASCULAR  . Dementia ([place])   . Diarrhea    SEVERAL DAYS/ GIVEN MED TO STOP AT HOME HE LIVES IN  . Dysrhythmia    AFIB  . GERD (gastroesophageal reflux disease)   . Headache    MIGRAINE  . Heart murmur    SURGICAL REPAIR  . HOH (hard of hearing)   . Hypercholesterolemia   . Hypertension   . Myocardial infarction ([place])   . Neuromuscular disorder ([place])    MUSCLE WEAKNESS  . Osteoporosis   . Peripheral vascular disease ([place])   . Presence of permanent cardiac pacemaker   . Seizures ([place])    x1 years ago no longer on anti seizure medicines  . Stroke [facility]) 1996   more weakness left side  CEREBELLAR STROKE SYNDROME  . Tunnel vision   . Vision loss    bil tunnel vision    SOCIAL HX:  Social History   Tobacco Use  . Smoking status: Former Research scientist (life sciences)  . Smokeless tobacco: Never Used  Substance Use Topics  . Alcohol use: No    ALLERGIES: No Known Allergies   PERTINENT MEDICATIONS:  Outpatient Encounter Medications as of 12/13/2019  Medication Sig  . acetaminophen (TYLENOL) 325 MG tablet Take 650 mg by mouth every 6 (six) hours as needed  for mild pain.  . calcium gluconate 500 MG tablet Take 500 mg by mouth 2 (two) times daily.  Marland Kitchen donepezil (ARICEPT) 10 MG tablet Take 10 mg by mouth daily.   . furosemide (LASIX) 20 MG tablet Take 20 mg by mouth daily.   . hydroxypropyl methylcellulose / hypromellose (ISOPTO TEARS / GONIOVISC) 2.5 % ophthalmic solution Place 2 drops into both eyes daily as needed for dry eyes.  Marland Kitchen loperamide (IMODIUM A-D) 2 MG tablet Take 4 mg by mouth See admin instructions. Give 2 tablets after first loose stool and 1 tablet after each consecutive stool  . memantine (NAMENDA) 10 MG tablet Take 10 mg by mouth 2 (two) times daily.   . metolazone (ZAROXOLYN) 2.5 MG tablet Take 2.5 mg by mouth daily.  . metoprolol tartrate (LOPRESSOR) 25 MG tablet Take 37.5 mg by mouth 2 (two) times daily.   . Multiple Vitamin (MULTIVITAMIN WITH MINERALS) TABS tablet Take 1 tablet by mouth daily.  . ramipril (ALTACE) 5 MG capsule Take 5 mg by mouth daily.   . simvastatin (ZOCOR) 10 MG tablet Take 10 mg by mouth at bedtime.   . tamsulosin (FLOMAX) 0.4 MG CAPS capsule Take 0.4 mg by mouth daily.  . Vitamin D, Ergocalciferol, (DRISDOL) 50000 units CAPS capsule Take 50,000 Units by mouth every 30 (thirty) days. (take on the 7th of the month)   No facility-administered encounter medications on file as of 12/13/2019.    PHYSICAL EXAM / ROS:   Current and past weights: 147-150 lbs, General: NAD, frail appearing, thin Cardiovascular: no chest pain reported, no edema Pulmonary: no cough, no increased SOB, lungs clear, oxygen as needed Abdomen: appetite variable, endorses constipation, incontinent of bowel GU: denies dysuria, incontinent of urine MSK:  no joint deformities, non ambulatory, pivots with 2 assis Skin: sacral decubitus stage 4 Neurological: Weakness, hallucinations, denies pain at rest.  Jason Coop, NP [facility]  COVID-19 PATIENT SCREENING TOOL  Person answering questions: ____________Staff_______ _____   1.   Is the patient or any family member in the home showing any signs or symptoms regarding respiratory infection?               Person with Symptom- __________NA_________________  a. Fever                                                                          Yes___ No___          ___________________  b. Shortness of breath  Yes___ No___          ___________________ c. Cough/congestion                                       Yes___  No___         ___________________ d. Body aches/pains                                                         Yes___ No___        ____________________ e. Gastrointestinal symptoms (diarrhea, nausea)           Yes___ No___        ____________________  2. Within the past 14 days, has anyone living in the home had any contact with someone with or under investigation for COVID-19?    Yes___ No_X_   Person __________________

## 2019-12-22 ENCOUNTER — Telehealth: Payer: Self-pay | Admitting: Primary Care

## 2019-12-22 NOTE — Telephone Encounter (Signed)
T/c from SNF reporting left sided weakness. Patient does not have speech affection. He is now 138 lbs per staff, which is a significant weight loss. I continue to advocate hospice referral. In the past several recommendations resulted in family not choosing to enroll. I offered to do a telemedicine visit but SNF staff states they will follow up with POA.

## 2019-12-27 ENCOUNTER — Non-Acute Institutional Stay: Payer: Medicare Other | Admitting: Primary Care

## 2019-12-27 ENCOUNTER — Other Ambulatory Visit: Payer: Self-pay

## 2019-12-27 DIAGNOSIS — Z515 Encounter for palliative care: Secondary | ICD-10-CM

## 2019-12-27 NOTE — Progress Notes (Signed)
[place] Consult Note Telephone: [phone]  Fax: [phone]    PATIENT NAME: John Rodgers [facility] [place] [address] [phone] (home)  DOB: 12-Oct-1945 MRN: [id number]  PRIMARY CARE PROVIDER:   Juluis Pitch, MD, [address] [place] [address] [phone]  REFERRING PROVIDER:  Juluis Pitch, MD [phone] [address] [place],  [place] [address] [phone]  RESPONSIBLE PARTY:   Extended Emergency Contact Information Primary Emergency Contact: Christan, Defranco Mobile Phone: [phone] Relation: Nephew   I met with John Rodgers in his room  ASSESSMENT AND RECOMMENDATIONS:   1. Advance Care Planning/Goals of Care: Goals include to maximize quality of life and symptom management. MOST with DNR, comfort measures, use of abx and iv, no feeding tubes.  2. Symptom Management: Patient continues to decline, losing weight and will not be able to heal sacral wound. Has wound vac in place now x 1 month. Pain ongoing, recommend increasing oxycodone to 7.5 mg q 4 hrs. Begin ATC tylenol as well.  Buttock wound is making pain control more difficult. Stye over left upper eye lid, recommend erythromycin 1 cm ribbon to affected eye qid x 5 days. Also needs warm compresses qid. Recommend help with meals to improve intake.   3. Family /Caregiver/Community Supports: Nephew is [employer], lives in [place].  4. Cognitive / Functional decline: Continues to decline in alertness and mental functions. Unable to sit due to decubitus. Needs help with all adls and iadls.  5. Follow up Palliative Care Visit: Palliative care will continue to follow for goals of care clarification and symptom management. Return 4 weeks or prn.  I spent 25 minutes providing this consultation,  from 1030 to 1055. More than 50% of the time in this consultation was spent coordinating communication.   HISTORY OF PRESENT ILLNESS:  John Rodgers is a 74 y.o. year old male  with multiple medical problems including abnormal weight loss, skin breakdown,covid +,arthritis, CHF, dementia, hyperlipidemia, hypertension, MI, peripheral vascular disease, seizures, recentaltered mental statusand pleural effusion in 11/20. Palliative Care was asked to follow this patient by consultation request of Juluis Pitch, MD to help address advance care planning and goals of care. This is a follow up visit.  CODE STATUS:   PPS: 30% HOSPICE ELIGIBILITY/DIAGNOSIS: yes, due to debility, wound, advanced dementia PAST MEDICAL HISTORY:  Past Medical History:  Diagnosis Date  . Arthritis   . CHF (congestive heart failure) ([place])   . Coronary artery disease   . Dementia ([place])    VASCULAR  . Dementia ([place])   . Diarrhea    SEVERAL DAYS/ GIVEN MED TO STOP AT HOME HE LIVES IN  . Dysrhythmia    AFIB  . GERD (gastroesophageal reflux disease)   . Headache    MIGRAINE  . Heart murmur    SURGICAL REPAIR  . HOH (hard of hearing)   . Hypercholesterolemia   . Hypertension   . Myocardial infarction ([place])   . Neuromuscular disorder ([place])    MUSCLE WEAKNESS  . Osteoporosis   . Peripheral vascular disease ([place])   . Presence of permanent cardiac pacemaker   . Seizures ([place])    x1 years ago no longer on anti seizure medicines  . Stroke [facility]) 1996   more weakness left side CEREBELLAR STROKE SYNDROME  . Tunnel vision   . Vision loss    bil tunnel vision    SOCIAL HX:  Social History   Tobacco Use  . Smoking status: Former  Smoker  . Smokeless tobacco: Never Used  Substance Use Topics  . Alcohol use: No    ALLERGIES: No Known Allergies   PERTINENT MEDICATIONS:  Outpatient Encounter Medications as of 12/27/2019  Medication Sig  . acetaminophen (TYLENOL) 325 MG tablet Take 650 mg by mouth every 6 (six) hours as needed for mild pain.  . Amino Acids-Protein Hydrolys (FEEDING SUPPLEMENT, PRO-STAT SUGAR FREE 64,) LIQD Take 30 mLs by mouth 2 (two) times daily.  . calcium gluconate  500 MG tablet Take 500 mg by mouth 2 (two) times daily.  Marland Kitchen donepezil (ARICEPT) 10 MG tablet Take 10 mg by mouth daily.   . furosemide (LASIX) 20 MG tablet Take 20 mg by mouth daily.   Marland Kitchen glycopyrrolate (ROBINUL) 1 MG tablet Take 1.5 mg by mouth 4 (four) times daily as needed.  . hydroxypropyl methylcellulose / hypromellose (ISOPTO TEARS / GONIOVISC) 2.5 % ophthalmic solution Place 2 drops into both eyes daily as needed for dry eyes.  Marland Kitchen loperamide (IMODIUM A-D) 2 MG tablet Take 4 mg by mouth See admin instructions. Give 2 tablets after first loose stool and 1 tablet after each consecutive stool  . LORazepam (ATIVAN) 0.5 MG tablet Take 0.5 mg by mouth every 6 (six) hours as needed for anxiety.  . megestrol (MEGACE) 400 MG/10ML suspension Take by mouth daily.  . memantine (NAMENDA) 10 MG tablet Take 10 mg by mouth 2 (two) times daily.   . metolazone (ZAROXOLYN) 2.5 MG tablet Take 2.5 mg by mouth daily.  . metoprolol tartrate (LOPRESSOR) 25 MG tablet Take 37.5 mg by mouth 2 (two) times daily.   . Multiple Vitamin (MULTIVITAMIN WITH MINERALS) TABS tablet Take 1 tablet by mouth daily.  Marland Kitchen oxyCODONE (OXY IR/ROXICODONE) 5 MG immediate release tablet Take 5 mg by mouth every 4 (four) hours as needed for severe pain.  . potassium chloride SA (KLOR-CON) 20 MEQ tablet Take 20 mEq by mouth 2 (two) times daily.  . ramipril (ALTACE) 5 MG capsule Take 5 mg by mouth daily.   . sennosides-docusate sodium (SENOKOT-S) 8.6-50 MG tablet Take 1 tablet by mouth 2 (two) times daily.  . sertraline (ZOLOFT) 50 MG tablet Take 100 mg by mouth daily.  . simvastatin (ZOCOR) 10 MG tablet Take 10 mg by mouth at bedtime.   . tamsulosin (FLOMAX) 0.4 MG CAPS capsule Take 0.4 mg by mouth daily.  . Vitamin D, Ergocalciferol, (DRISDOL) 50000 units CAPS capsule Take 50,000 Units by mouth every 30 (thirty) days. (take on the 7th of the month)   No facility-administered encounter medications on file as of 12/27/2019.    PHYSICAL EXAM /  ROS:   Current and past weights: 138.4 lbs, down from hospital weight of 154 lbs in early 11/20.  General: NAD, frail appearing, thin Cardiovascular: no chest pain reported, no edema, S1S2 Pulmonary: no cough, no increased SOB, oxygen at 3 L, rales in bases Abdomen: appetite fair, endorses occ constipation, incontinent of bowel GU: denies dysuria, incontinent of urine MSK:  no joint deformities, non  ambulatory Skin: stage 4 on sacrum Neurological: Weakness, uncontrolled pain, dementia with confusion  Jason Coop, NP [facility]  COVID-19 PATIENT SCREENING TOOL  Person answering questions: ____________Staff_______ _____   1.  Is the patient or any family member in the home showing any signs or symptoms regarding respiratory infection?               Person with Symptom- __________NA_________________  a. Fever  Yes___ No___          ___________________  b. Shortness of breath                                                    Yes___ No___          ___________________ c. Cough/congestion                                       Yes___  No___         ___________________ d. Body aches/pains                                                         Yes___ No___        ____________________ e. Gastrointestinal symptoms (diarrhea, nausea)           Yes___ No___        ____________________  2. Within the past 14 days, has anyone living in the home had any contact with someone with or under investigation for COVID-19?    Yes___ No_X_   Person __________________   Palliative Consultation  Note Jason Coop, [place], AGPCNP-BC, [place], [place] [address] [email or web address] Office [phone] Voicemail [phone] Fax [phone] Cell [phone]

## 2020-01-17 ENCOUNTER — Other Ambulatory Visit: Payer: Self-pay

## 2020-01-17 ENCOUNTER — Telehealth: Payer: Self-pay | Admitting: Primary Care

## 2020-01-17 ENCOUNTER — Non-Acute Institutional Stay: Payer: Medicare Other | Admitting: Primary Care

## 2020-01-17 NOTE — Telephone Encounter (Signed)
Advised by SNF that patient passed away at the facility after midnight last night, DOD 2020/01/25.

## 2020-01-27 DEATH — deceased

## 2021-03-10 IMAGING — CT CT ABDOMEN AND PELVIS WITH CONTRAST
2 of 5 series · 15 of 46 positions shown, 17 images · IV contrast (omnipaque)
Comparison: CT Abdomen and Pelvis 10/28/2009.

CLINICAL DATA: 73-year-old male with 2 days of right lower quadrant
pain. History of hernia.

EXAM:
CT ABDOMEN AND PELVIS WITH CONTRAST
TECHNIQUE: Multidetector CT imaging of the abdomen and pelvis was performed
using the standard protocol following bolus administration of
intravenous contrast.
CONTRAST:  100mL OMNIPAQUE IOHEXOL 300 MG/ML  SOLN

[Series 2: routine abd/pel with · axial · 0.77mm/px · z∈[-540,-110]mm · 12 of 98 slices shown, 14 images]
[im 6/98  soft-tissue]
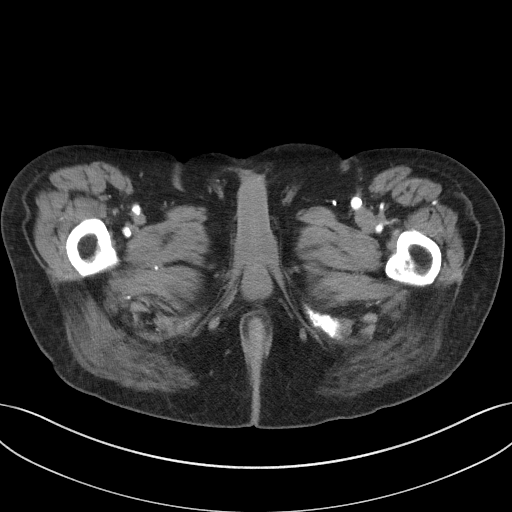
[im 6/98  bone]
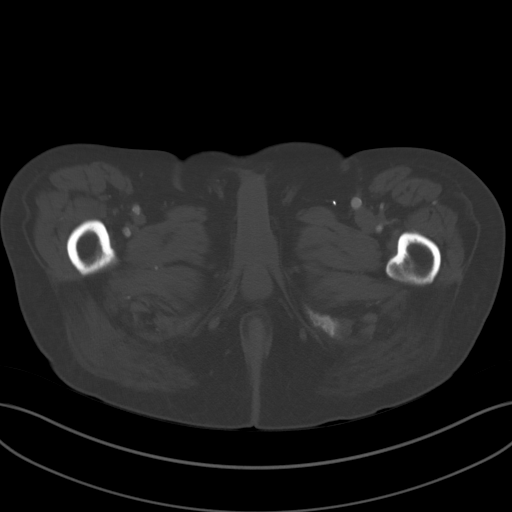
[im 18/98  soft-tissue]
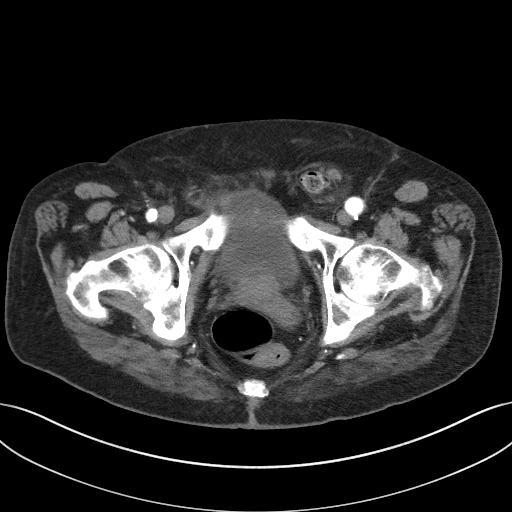
[im 23/98  soft-tissue]
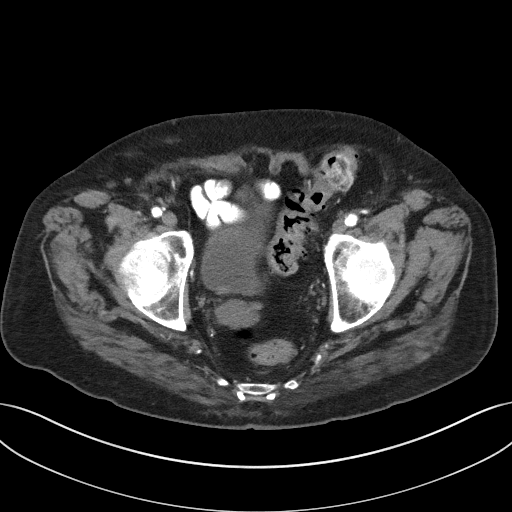
[im 29/98  soft-tissue]
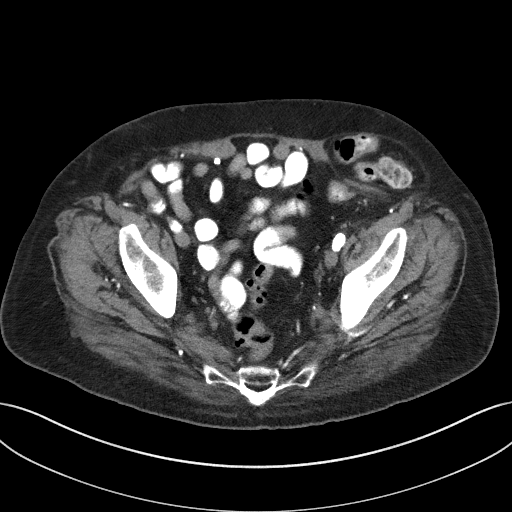
[im 40/98  soft-tissue]
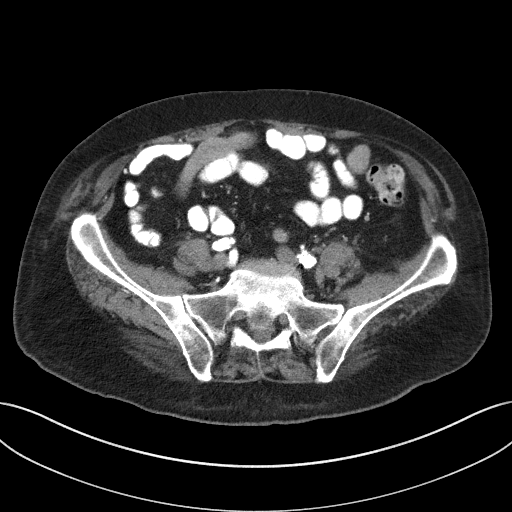
[im 46/98  soft-tissue]
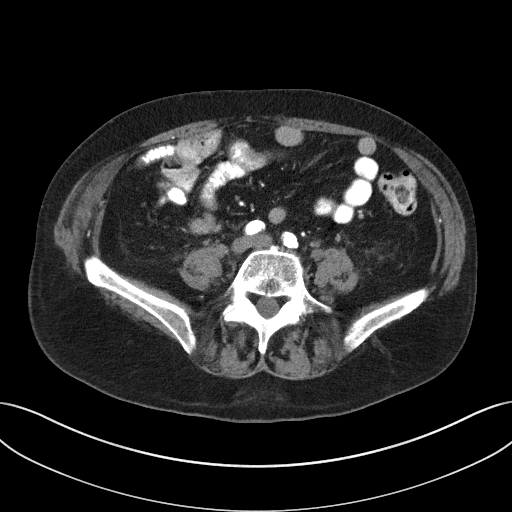
[im 52/98  soft-tissue]
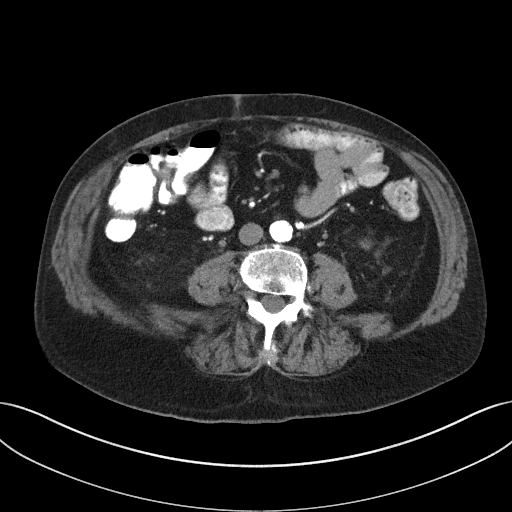
[im 63/98  soft-tissue]
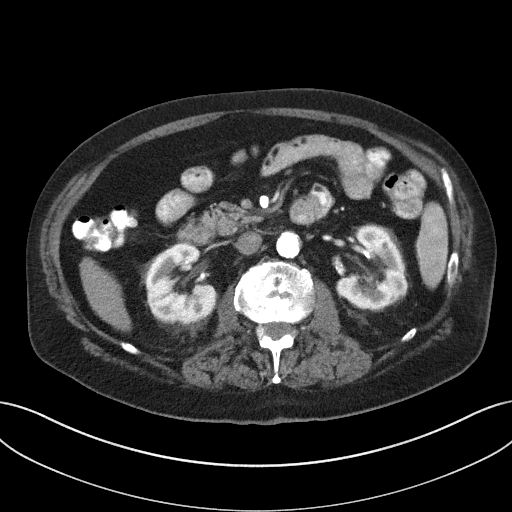
[im 69/98  soft-tissue]
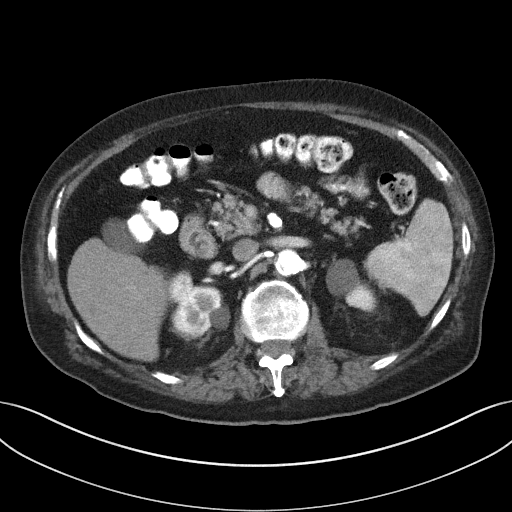
[im 69/98  bone]
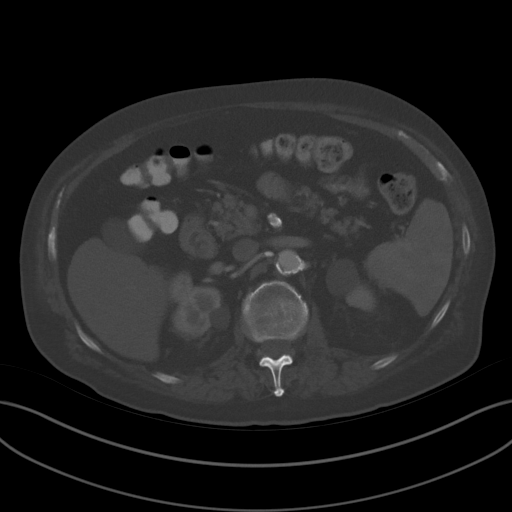
[im 75/98  soft-tissue]
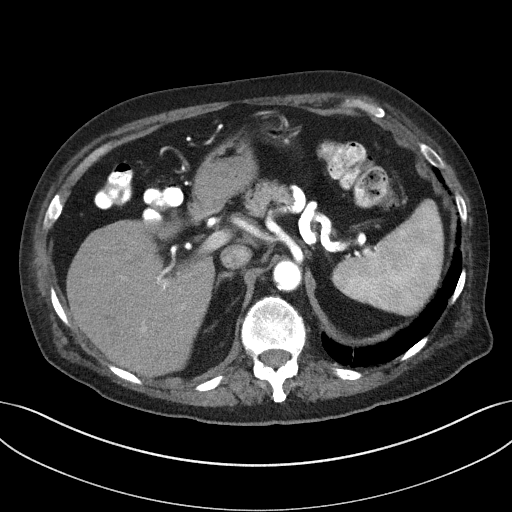
[im 86/98  soft-tissue]
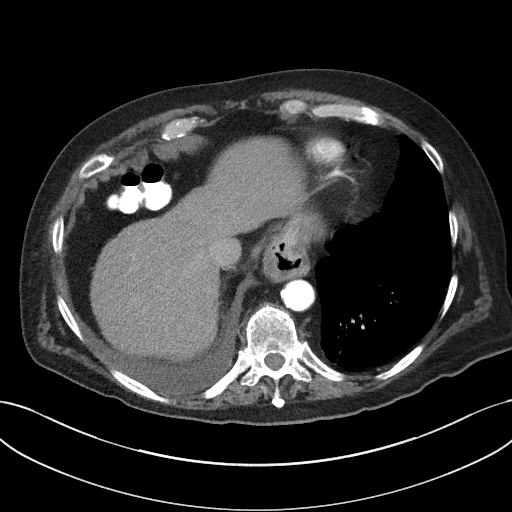
[im 92/98  soft-tissue]
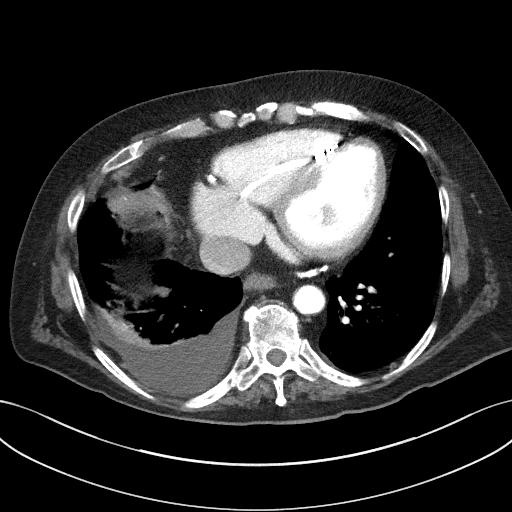

[Series 5: coronal st · coronal · 0.84mm/px · 3 of 94 slices shown]
[im 32/94  soft-tissue]
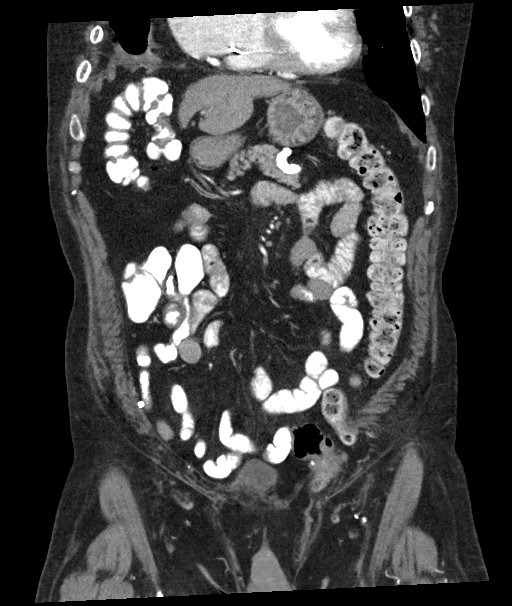
[im 42/94  soft-tissue]
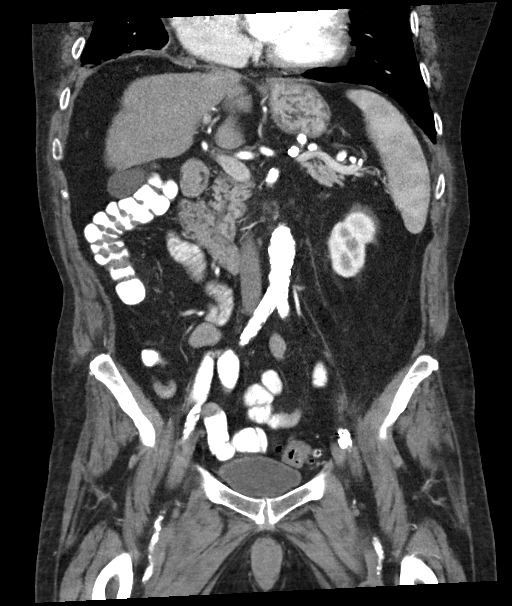
[im 52/94  soft-tissue]
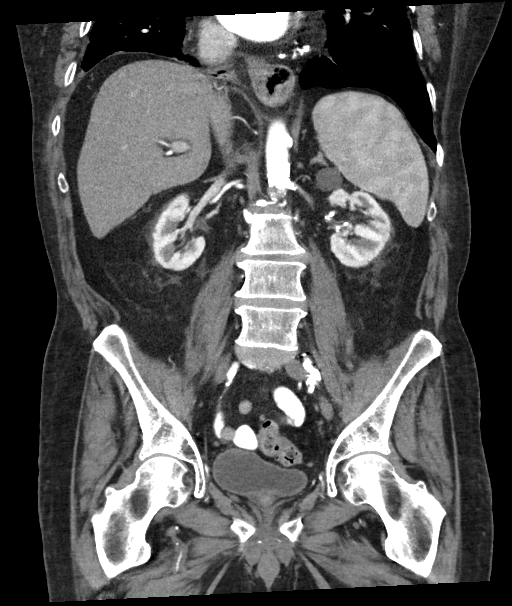

[15 of 46 positions shown; findings below may reference images not displayed]

FINDINGS: Lower chest: Small to moderate size layering right pleural effusion
with simple fluid density suggesting transudate. Mild cardiomegaly
has increased since 5444. Partially visible cardiac pacer/AICD
leads. No pericardial effusion. No left pleural effusion.

Right lung base atelectasis. Stable mild left lower lobe scarring.

Hepatobiliary: Chronic cholelithiasis with individual stones
estimated at 9 millimeters. No pericholecystic inflammation.
Negative liver; occasional subcentimeter hepatic low-density areas
are stable since 5444 and benign.

Pancreas: Negative.

Spleen: Negative.

Adrenals/Urinary Tract: Normal adrenal glands.

Small chronic renal upper pole cysts with simple fluid density
appear benign. Chronic but increased nonspecific bilateral
perinephric stranding with symmetric renal enhancement and renal
contrast excretion. No hydronephrosis. Renal vascular calcifications
bilaterally, no definite nephrolithiasis. Both ureters are
decompressed and negative. Unremarkable urinary bladder.

Stomach/Bowel: Negative rectum. Sigmoid diverticulosis. The junction
of the sigmoid and descending colon is located within the left lower
quadrant hernia on series 2, image 73. Oral contrast has reached
this segment of bowel. No definite active sigmoid inflammation.

The upstream descending colon, transverse and right colon are
negative. The cecum is on a lax mesentery. Normal terminal ileum.
Diminutive and normal appendix (coronal image 38).

Superimposed central and right lower abdomen hernia repair with
mesh. No dilated or inflamed small bowel. Presumed fatty small-bowel
enteric contents rather than an elongated small bowel lipoma on
coronal image 19. Some similar fatty enteric contents are noted in
the stomach and duodenum.

Small gastric hiatal hernia. Otherwise negative stomach. No free
air, free fluid.

Vascular/Lymphatic: Extensive Aortoiliac calcified atherosclerosis.
Ectatic infrarenal abdominal aorta measuring 25 millimeters
diameter. Bilateral iliac artery stents. Postoperative changes also
to the left distal external iliac/common femoral artery. Despite the
arterial disease the visible Major arterial structures remain
patent. Portal venous system appears grossly patent.

Reproductive: High left inguinal versus spigelian abdominal hernia
measuring about 10 centimeters (series 2, image 73, sagittal image
103) new since 5444. Otherwise negative.

Other: No pelvic free fluid.

Musculoskeletal: No acute osseous abnormality identified.
IMPRESSION: 1. Left lower quadrant hernia containing the junction of the sigmoid
and descending colon. Previous right lower abdominal hernia repair
with mesh. No bowel obstruction or convincing bowel inflammation.
Normal appendix. Sigmoid diverticulosis.
2. Small to moderate layering right pleural effusion is new since
5444 with right lung base atelectasis. Cardiomegaly has increased.
3. Chronic but increased bilateral perinephric stranding is
nonspecific but probably related to chronic intrinsic renal disease.
4. Chronic cholelithiasis.
5. Aortic Atherosclerosis (DYGE2-CTO.O) and ectatic abdominal aorta
at risk for aneurysm development.
Recommend followup by Ultrasound in 5 years. This recommendation
follows ACR consensus guidelines: White Paper of the ACR Incidental
